# Patient Record
Sex: Female | Born: 1985 | Hispanic: Yes | Marital: Single | Smoking: Never smoker
Health system: Southern US, Community
[De-identification: ages and names within clinical notes are randomized; demographics above are authoritative.]

## PROBLEM LIST (undated history)

## (undated) ENCOUNTER — Inpatient Hospital Stay (HOSPITAL_COMMUNITY): Payer: Self-pay

## (undated) DIAGNOSIS — J45909 Unspecified asthma, uncomplicated: Secondary | ICD-10-CM

## (undated) HISTORY — PX: BARTHOLIN GLAND CYST EXCISION: SHX565

---

## 2009-03-30 ENCOUNTER — Ambulatory Visit (HOSPITAL_BASED_OUTPATIENT_CLINIC_OR_DEPARTMENT_OTHER): Admission: RE | Admit: 2009-03-30 | Discharge: 2009-03-30 | Payer: Self-pay | Admitting: Obstetrics and Gynecology

## 2011-04-04 LAB — POCT PREGNANCY, URINE: Preg Test, Ur: NEGATIVE

## 2011-05-08 NOTE — Op Note (Signed)
NAMEMERILEE, WIBLE             ACCOUNT NO.:  [id number]   MEDICAL RECORD NO.:  [id number]          PATIENT TYPE:  AMB   LOCATION:  [facility]                         FACILITY:  [facility]   PHYSICIAN:  Sherry A. Dickstein, M.D.DATE OF BIRTH:  1986/11/27   DATE OF PROCEDURE:  03/30/2009  DATE OF DISCHARGE:                               OPERATIVE REPORT   PREOPERATIVE DIAGNOSIS:  Left Bartholin cyst.   POSTOPERATIVE DIAGNOSIS:  Left Bartholin cyst.   PROCEDURE:  Left marsupialization of Bartholin cyst.   SURGEON:  Sherry A. Rosalio Macadamia, M.D.   ANESTHESIA:  General.   INDICATIONS:  This is a 25 year old G 0, P0 woman who has had an ongoing  left Bartholin cyst for several years.  This has gotten significantly  worse over the past few months and the patient requests surgical  intervention.  The patient has had this incised and drained in the past  but with recurrence.  Because of this, the patient requests going to the  operating room for marsupialization.   FINDINGS:  Approximately a 4 to 5 cm left Bartholin's cyst.  Once  opened, a significant amount of chocolate fluid was present.   PROCEDURE:  The patient is brought into the operating room and given  adequate general anesthesia.  She was placed in a dorsal lithotomy  position.  Her perineum was washed with Betadine.  She was draped in a  sterile fashion.  The area of the left Bartholin's gland was inspected.  Once this area was well enough visualized, it was decided that the  incision needed to be made partially inside the introitus and partially  outside of the introitus.  An elliptical incision was made and the  epithelium was excised.  The inside epithelium of the cyst was incised  in a linear fashion and copious amounts of chocolate fluid was released.  This incision was extended and the edges of the cyst and epithelial  tissues were infiltrated with 0.5% Marcaine with epinephrine.  The edges  were then approximated with 3-0 plain  in interrupted stitches  circumferentially until adequate hemostasis was present and the internal  cyst wall was approximated to the external epithelium.  Once adequate  hemostasis was present, these marsupialization was completed, all fluid  had been removed from the cyst and there were no solid nodules present  inside the cyst wall.  The patient was removed from the dorsal lithotomy  position after being cleaned up.  She was extubated.  She was moved from  the operating table to a stretcher in stable condition.  Complications  were none.  Estimated blood loss was 5 mL.      Sherry A. Rosalio Macadamia, M.D.  Electronically Signed    SAD/MEDQ  D:  03/30/2009  T:  03/30/2009  Job:  [id number]

## 2011-12-25 NOTE — L&D Delivery Note (Signed)
Pt had pitocin aug throughout labor. During labor she had occ decels, but maintained good BTB variability and accels. She pushed for 2 hours and brought the baby to +2 station. The variable decels were becoming deeper so the Simpson forceps were placed in the LOA position. She delivered one live viable female infant over a 3-0 degree midline tear. Placenta-S/I. EBL-400cc. NICU was present for delivery. Tear closed with 2-0 Vicryl and 3-0 Chromic.

## 2012-01-30 ENCOUNTER — Ambulatory Visit (INDEPENDENT_AMBULATORY_CARE_PROVIDER_SITE_OTHER): Payer: Managed Care, Other (non HMO) | Admitting: Family Medicine

## 2012-01-30 VITALS — BP 135/80 | HR 73 | Temp 97.6°F | Resp 16 | Ht 69.0 in | Wt 207.0 lb

## 2012-01-30 DIAGNOSIS — R5381 Other malaise: Secondary | ICD-10-CM

## 2012-01-30 DIAGNOSIS — R635 Abnormal weight gain: Secondary | ICD-10-CM

## 2012-01-30 DIAGNOSIS — R5383 Other fatigue: Secondary | ICD-10-CM

## 2012-01-30 DIAGNOSIS — J029 Acute pharyngitis, unspecified: Secondary | ICD-10-CM

## 2012-01-30 LAB — POCT CBC
Granulocyte percent: 54.1 %G (ref 37–80)
MID (cbc): 0.8 (ref 0–0.9)
MPV: 8.9 fL (ref 0–99.8)
POC MID %: 9.2 %M (ref 0–12)
Platelet Count, POC: 376 10*3/uL (ref 142–424)
RBC: 4.84 M/uL (ref 4.04–5.48)

## 2012-01-30 MED ORDER — AMOXICILLIN 500 MG PO TABS: ORAL_TABLET | ORAL | Status: DC

## 2012-01-30 NOTE — Patient Instructions (Signed)
Throat culture and thyroid tests were sent to lab.  If rash occurs, or any worsening, return to clinic.   Pharyngitis, Viral and Bacterial Pharyngitis is soreness (inflammation) or infection of the pharynx. It is also called a sore throat. CAUSES  Most sore throats are caused by viruses and are part of a cold. However, some sore throats are caused by strep and other bacteria. Sore throats can also be caused by post nasal drip from draining sinuses, allergies and sometimes from sleeping with an open mouth. Infectious sore throats can be spread from person to person by coughing, sneezing and sharing cups or eating utensils. TREATMENT  Sore throats that are viral usually last 3-4 days. Viral illness will get better without medications (antibiotics). Strep throat and other bacterial infections will usually begin to get better about 24-48 hours after you begin to take antibiotics. HOME CARE INSTRUCTIONS   If the caregiver feels there is a bacterial infection or if there is a positive strep test, they will prescribe an antibiotic. The full course of antibiotics must be taken. If the full course of antibiotic is not taken, you or your child may become ill again. If you or your child has strep throat and do not finish all of the medication, serious heart or kidney diseases may develop.   Drink enough water and fluids to keep your urine clear or pale yellow.   Only take over-the-counter or prescription medicines for pain, discomfort or fever as directed by your caregiver.   Get lots of rest.   Gargle with salt water ( tsp. of salt in a glass of water) as often as every 1-2 hours as you need for comfort.   Hard candies may soothe the throat if individual is not at risk for choking. Throat sprays or lozenges may also be used.  SEEK MEDICAL CARE IF:   Large, tender lumps in the neck develop.   A rash develops.   Green, yellow-brown or bloody sputum is coughed up.   Your baby is older than 3 months  with a rectal temperature of 100.5 F (38.1 C) or higher for more than 1 day.  SEEK IMMEDIATE MEDICAL CARE IF:   A stiff neck develops.   You or your child are drooling or unable to swallow liquids.   You or your child are vomiting, unable to keep medications or liquids down.   You or your child has severe pain, unrelieved with recommended medications.   You or your child are having difficulty breathing (not due to stuffy nose).   You or your child are unable to fully open your mouth.   You or your child develop redness, swelling, or severe pain anywhere on the neck.   You have a fever.   Your baby is older than 3 months with a rectal temperature of 102 F (38.9 C) or higher.   Your baby is 27 months old or younger with a rectal temperature of 100.4 F (38 C) or higher.  MAKE SURE YOU:   Understand these instructions.   Will watch your condition.   Will get help right away if you are not doing well or get worse.  Document Released: 12/10/2005 Document Revised: 08/22/2011 Document Reviewed: 03/08/2008 [facility] Patient Information 2012 [place], [place].  Return to the clinic or go to the nearest emergency room if any of your symptoms worsen or new symptoms occur.

## 2012-01-30 NOTE — Progress Notes (Signed)
Addended by: Meredith Staggers R on: 01/30/2012 09:20 PM   Modules accepted: Orders

## 2012-01-30 NOTE — Progress Notes (Signed)
Subjective:    Patient ID: Kristy Tucker, female    DOB: [birth year], 26 y.o.   MRN: [id number]  HPI Congestion/cough x 1 month.  Hx of menieres off and on migraines - chronic.  Woke up this am with sore throat, hard to swallow, pus in back of throat.  NO known fever.   Boyfriend with congestion/cough. No known sick contacts with mono or strep.    Tx: none   Review of Systems  Constitutional: Positive for fatigue and unexpected weight change. Negative for fever and chills.       Wt gain x 2-3 months, taken iodine supplements occasionally (takes multiple herbal supplements).  Fatigue x past month.  HENT: Positive for congestion, sore throat and trouble swallowing. Negative for hearing loss, ear pain, neck pain, neck stiffness, voice change and ear discharge.   Eyes: Negative for discharge and redness.  Respiratory: Positive for cough and shortness of breath. Negative for chest tightness.        Occasional dyspnea- felt some today.  Cardiovascular: Negative for leg swelling.  Gastrointestinal: Negative for nausea, vomiting, abdominal pain, diarrhea, constipation, blood in stool and anal bleeding.  Skin: Negative for rash.  Hematological: Positive for adenopathy.  LMP currently - normal menses.     Objective:   Physical Exam  Constitutional: She is oriented to person, place, and time. She appears well-developed and well-nourished.  HENT:  Head: Normocephalic and atraumatic. No trismus in the jaw.  Right Ear: Tympanic membrane, external ear and ear canal normal.  Left Ear: Tympanic membrane, external ear and ear canal normal.  Mouth/Throat: Uvula is midline and mucous membranes are normal. No uvula swelling. Oropharyngeal exudate and posterior oropharyngeal erythema present. No posterior oropharyngeal edema or tonsillar abscesses.    Neck: Phonation normal. No thyromegaly present.  Cardiovascular: Normal rate, regular rhythm and normal heart sounds.   No murmur  heard. Pulmonary/Chest: Effort normal and breath sounds normal. No respiratory distress. She has no wheezes.  Abdominal: Soft. She exhibits no distension and no mass. There is no hepatosplenomegaly. There is no tenderness.  Lymphadenopathy:    She has cervical adenopathy.  Neurological: She is alert and oriented to person, place, and time.  Skin: Skin is warm and dry. She is not diaphoretic.  Psychiatric: She has a normal mood and affect. Her behavior is normal.    Results for orders placed in visit on 01/30/12  POCT RAPID STREP A (OFFICE)      Component Value Range   Rapid Strep A Screen Negative  Negative   POCT CBC      Component Value Range   WBC 8.6  4.6 - 10.2 (K/uL)   Lymph, poc 3.2  0.6 - 3.4    POC LYMPH PERCENT 36.7  10 - 50 (%L)   MID (cbc) 0.8  0 - 0.9    POC MID % 9.2  0 - 12 (%M)   POC Granulocyte 4.7  2 - 6.9    Granulocyte percent 54.1  37 - 80 (%G)   RBC 4.84  4.04 - 5.48 (M/uL)   Hemoglobin 12.5  12.2 - 16.2 (g/dL)   HCT, POC 40.9  81.1 - 47.9 (%)   MCV 83.4  80 - 97 (fL)   MCH, POC 25.8 (*) 27 - 31.2 (pg)   MCHC 30.9 (*) 31.8 - 35.4 (g/dL)   RDW, POC 91.4     Platelet Count, POC 376  142 - 424 (K/uL)   MPV 8.9  0 - 99.8 (fL)  Assessment & Plan:  Emylee Decelle is a 26 y.o. female 1. Pharyngitis  POCT rapid strep A, POCT CBC  2. Fatigue  POCT CBC, TSH  3. Weight gain, abnormal  TSH   Exudative tonsilitis - possible false neg strep screen, vs. CMV vs. EBV.  Reassuring cbc. Check throat cx, start amox 500tid (rtc if any rash), sx care - motrin, cold fluids, lozenges, rtc precautions, contact precautions.  Fatigue with wt gain.  Check TSH, may be due to current illness.  If TSH wnl, consider EBV/CMV titers.

## 2012-02-02 LAB — CULTURE, GROUP A STREP: Organism ID, Bacteria: NORMAL

## 2012-10-02 ENCOUNTER — Ambulatory Visit: Payer: Managed Care, Other (non HMO) | Admitting: Family Medicine

## 2012-10-07 ENCOUNTER — Ambulatory Visit: Payer: Self-pay | Admitting: Family Medicine

## 2012-10-07 ENCOUNTER — Encounter: Payer: Self-pay | Admitting: Family Medicine

## 2012-10-07 VITALS — BP 118/78 | HR 81 | Temp 98.2°F | Resp 16 | Ht 68.5 in | Wt 241.8 lb

## 2012-10-07 DIAGNOSIS — Z0000 Encounter for general adult medical examination without abnormal findings: Secondary | ICD-10-CM | POA: Insufficient documentation

## 2012-10-07 DIAGNOSIS — Z23 Encounter for immunization: Secondary | ICD-10-CM

## 2012-10-07 NOTE — Patient Instructions (Signed)
You have requested [facility] be your primary care provider; I will be your physician and you can be seen by appointment at [address] If you have any emergencies or I am not available, you can be seen next door at [address] Best wishes for the remainder of your pregnancy and a "blissful" labor and delivery of your baby boy.  I will see you sometime next year.

## 2012-10-07 NOTE — Progress Notes (Signed)
S:  This 26 y.o. Female is [redacted] weeks pregnant and here for Tdap to be administered. This immunization was not available at her OB/GYN's office. She has been well, w/o any symptoms of URI (cough, fever, HA, fatigue,etc). This is here 1st child  and it is a boy. She plans to breast-feed; she has gained ~ 29 lbs and has had problems with pedal edema. Otherwise, she feels well.  O:  Filed Vitals:   10/07/12 1349  BP: 118/78  Pulse: 81  Temp: 98.2 F (36.8 C)  Resp: 16    GEN: In NAD; WN,WD. Near term pregnancy. HENT: [place]/AT; EOMI, conj/scl clear; post ph clear w/o erythema. COR: RRR. Trace-1+ pretibial edema. LUNGS: Normal resp rate; unlabored. NEURO: A&O x 3; CNs intact. Nonfocal.  A/P:  1. Need for prophylactic vaccination with combined diphtheria-tetanus-pertussis (DTP) vaccine  Tdap vaccine greater than or equal to 7yo IM

## 2012-10-30 ENCOUNTER — Inpatient Hospital Stay (HOSPITAL_COMMUNITY)
Admission: AD | Admit: 2012-10-30 | Discharge: 2012-11-01 | DRG: 775 | Disposition: A | Discharge status: Home / Self Care | Payer: Medicaid Other | Source: Ambulatory Visit | Attending: Obstetrics and Gynecology | Admitting: Obstetrics and Gynecology

## 2012-10-30 ENCOUNTER — Inpatient Hospital Stay (HOSPITAL_COMMUNITY): Payer: Medicaid Other

## 2012-10-30 ENCOUNTER — Encounter (HOSPITAL_COMMUNITY): Payer: Self-pay | Admitting: Anesthesiology

## 2012-10-30 ENCOUNTER — Inpatient Hospital Stay (HOSPITAL_COMMUNITY): Payer: Medicaid Other | Admitting: Anesthesiology

## 2012-10-30 ENCOUNTER — Encounter (HOSPITAL_COMMUNITY): Payer: Self-pay

## 2012-10-30 HISTORY — DX: Unspecified asthma, uncomplicated: J45.909

## 2012-10-30 LAB — CBC
Hemoglobin: 11.6 g/dL — ABNORMAL LOW (ref 12.0–15.0)
MCH: 26.7 pg (ref 26.0–34.0)
MCV: 78.8 fL (ref 78.0–100.0)
RBC: 4.34 MIL/uL (ref 3.87–5.11)

## 2012-10-30 LAB — TYPE AND SCREEN: Antibody Screen: NEGATIVE

## 2012-10-30 MED ORDER — SIMETHICONE 80 MG PO CHEW: 80.0000 mg | CHEWABLE_TABLET | ORAL | Status: DC | PRN

## 2012-10-30 MED ORDER — EPHEDRINE 5 MG/ML INJ: 10.0000 mg | INTRAVENOUS | Status: DC | PRN

## 2012-10-30 MED ORDER — OXYTOCIN 40 UNITS IN LACTATED RINGERS INFUSION - SIMPLE MED: 62.5000 mL/h | INTRAVENOUS | Status: DC

## 2012-10-30 MED ORDER — WITCH HAZEL-GLYCERIN EX PADS: 1.0000 "application " | MEDICATED_PAD | CUTANEOUS | Status: DC | PRN

## 2012-10-30 MED ORDER — OXYCODONE-ACETAMINOPHEN 5-325 MG PO TABS: 1.0000 | ORAL_TABLET | ORAL | Status: DC | PRN

## 2012-10-30 MED ORDER — OXYTOCIN BOLUS FROM INFUSION: 500.0000 mL | INTRAVENOUS | Status: DC

## 2012-10-30 MED ORDER — TERBUTALINE SULFATE 1 MG/ML IJ SOLN
0.2500 mg | Freq: Once | INTRAMUSCULAR | Status: AC | PRN
  Administered 2012-10-30: 0.25 mg via SUBCUTANEOUS

## 2012-10-30 MED ORDER — PHENYLEPHRINE 40 MCG/ML (10ML) SYRINGE FOR IV PUSH (FOR BLOOD PRESSURE SUPPORT): 80.0000 ug | PREFILLED_SYRINGE | INTRAVENOUS | Status: DC | PRN

## 2012-10-30 MED ORDER — ZOLPIDEM TARTRATE 5 MG PO TABS: 5.0000 mg | ORAL_TABLET | Freq: Every evening | ORAL | Status: DC | PRN

## 2012-10-30 MED ORDER — LIDOCAINE HCL (PF) 1 % IJ SOLN
30.0000 mL | INTRAMUSCULAR | Status: DC | PRN
  Filled 2012-10-30: qty 30

## 2012-10-30 MED ORDER — ONDANSETRON HCL 4 MG PO TABS: 4.0000 mg | ORAL_TABLET | ORAL | Status: DC | PRN

## 2012-10-30 MED ORDER — SODIUM BICARBONATE 8.4 % IV SOLN
INTRAVENOUS | Status: DC | PRN
  Administered 2012-10-30: 5 mL via EPIDURAL

## 2012-10-30 MED ORDER — LACTATED RINGERS IV SOLN
INTRAVENOUS | Status: DC
  Administered 2012-10-30 (×2): via INTRAUTERINE

## 2012-10-30 MED ORDER — DIPHENHYDRAMINE HCL 50 MG/ML IJ SOLN: 12.5000 mg | INTRAMUSCULAR | Status: DC | PRN

## 2012-10-30 MED ORDER — BENZOCAINE-MENTHOL 20-0.5 % EX AERO: 1.0000 "application " | INHALATION_SPRAY | CUTANEOUS | Status: DC | PRN

## 2012-10-30 MED ORDER — PHENYLEPHRINE 40 MCG/ML (10ML) SYRINGE FOR IV PUSH (FOR BLOOD PRESSURE SUPPORT)
80.0000 ug | PREFILLED_SYRINGE | INTRAVENOUS | Status: DC | PRN
  Filled 2012-10-30: qty 5

## 2012-10-30 MED ORDER — DIBUCAINE 1 % RE OINT: 1.0000 "application " | TOPICAL_OINTMENT | RECTAL | Status: DC | PRN

## 2012-10-30 MED ORDER — MEASLES, MUMPS & RUBELLA VAC ~~LOC~~ INJ
0.5000 mL | INJECTION | Freq: Once | SUBCUTANEOUS | Status: DC
  Filled 2012-10-30: qty 0.5

## 2012-10-30 MED ORDER — ONDANSETRON HCL 4 MG/2ML IJ SOLN: 4.0000 mg | Freq: Four times a day (QID) | INTRAMUSCULAR | Status: DC | PRN

## 2012-10-30 MED ORDER — EPHEDRINE 5 MG/ML INJ
10.0000 mg | INTRAVENOUS | Status: DC | PRN
  Filled 2012-10-30: qty 4

## 2012-10-30 MED ORDER — TETANUS-DIPHTH-ACELL PERTUSSIS 5-2.5-18.5 LF-MCG/0.5 IM SUSP: 0.5000 mL | Freq: Once | INTRAMUSCULAR | Status: DC

## 2012-10-30 MED ORDER — IBUPROFEN 600 MG PO TABS
600.0000 mg | ORAL_TABLET | Freq: Four times a day (QID) | ORAL | Status: DC
  Administered 2012-10-31 – 2012-11-01 (×6): 600 mg via ORAL
  Filled 2012-10-30 (×6): qty 1

## 2012-10-30 MED ORDER — SENNOSIDES-DOCUSATE SODIUM 8.6-50 MG PO TABS
2.0000 | ORAL_TABLET | Freq: Every day | ORAL | Status: DC
  Administered 2012-10-31 (×2): 2 via ORAL

## 2012-10-30 MED ORDER — CITRIC ACID-SODIUM CITRATE 334-500 MG/5ML PO SOLN: 30.0000 mL | ORAL | Status: DC | PRN

## 2012-10-30 MED ORDER — TERBUTALINE SULFATE 1 MG/ML IJ SOLN
INTRAMUSCULAR | Status: AC
  Filled 2012-10-30: qty 1

## 2012-10-30 MED ORDER — ONDANSETRON HCL 4 MG/2ML IJ SOLN: 4.0000 mg | INTRAMUSCULAR | Status: DC | PRN

## 2012-10-30 MED ORDER — OXYTOCIN 40 UNITS IN LACTATED RINGERS INFUSION - SIMPLE MED
1.0000 m[IU]/min | INTRAVENOUS | Status: DC
Start: 2012-10-30 — End: 2012-10-30
  Administered 2012-10-30: 1 m[IU]/min via INTRAVENOUS
  Filled 2012-10-30: qty 1000

## 2012-10-30 MED ORDER — LIDOCAINE HCL (PF) 1 % IJ SOLN
INTRAMUSCULAR | Status: DC | PRN
  Administered 2012-10-30 (×2): 2 mL

## 2012-10-30 MED ORDER — LACTATED RINGERS IV SOLN
500.0000 mL | INTRAVENOUS | Status: DC | PRN
  Administered 2012-10-30 (×2): 500 mL via INTRAVENOUS

## 2012-10-30 MED ORDER — FENTANYL 2.5 MCG/ML BUPIVACAINE 1/10 % EPIDURAL INFUSION (WH - ANES)
14.0000 mL/h | INTRAMUSCULAR | Status: DC
  Administered 2012-10-30: 14 mL/h via EPIDURAL
  Filled 2012-10-30: qty 125

## 2012-10-30 MED ORDER — ACETAMINOPHEN 325 MG PO TABS: 650.0000 mg | ORAL_TABLET | ORAL | Status: DC | PRN

## 2012-10-30 MED ORDER — LACTATED RINGERS IV SOLN
INTRAVENOUS | Status: DC
Start: 2012-10-30 — End: 2012-10-30
  Administered 2012-10-30 (×3): via INTRAVENOUS

## 2012-10-30 MED ORDER — IBUPROFEN 600 MG PO TABS: 600.0000 mg | ORAL_TABLET | Freq: Four times a day (QID) | ORAL | Status: DC | PRN

## 2012-10-30 MED ORDER — LACTATED RINGERS IV SOLN
500.0000 mL | Freq: Once | INTRAVENOUS | Status: AC
  Administered 2012-10-30: 500 mL via INTRAVENOUS
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
## 2012-10-30 NOTE — MAU Note (Signed)
Pt states contractions started around 2000 and are about every 4 minutes. States she does not think her water has broken.

## 2012-10-30 NOTE — Anesthesia Procedure Notes (Signed)

## 2012-10-30 NOTE — Progress Notes (Signed)
Called to see pt during a decel. Pt had a spontaneous decel to 60's for 3-4 minutes. The Pitocin was turned off and she given SQ terb.  FHTs continued to have good BTB variability.  Now reactive without decels. Cx-C/9/0. Will follow and restart pitocin at 4:30.

## 2012-10-30 NOTE — Anesthesia Preprocedure Evaluation (Signed)
Anesthesia Evaluation  Patient identified by MRN, date of birth, ID band Patient awake    Reviewed: Allergy & Precautions, H&P , Patient's Chart, lab work & pertinent test results, reviewed documented beta blocker date and time   History of Anesthesia Complications Negative for: history of anesthetic complications  Airway Mallampati: II TM Distance: >3 FB Neck ROM: full    Dental No notable dental hx.    Pulmonary neg pulmonary ROS, asthma ,  breath sounds clear to auscultation  Pulmonary exam normal       Cardiovascular Exercise Tolerance: Good negative cardio ROS  Rhythm:regular Rate:Normal     Neuro/Psych negative neurological ROS  negative psych ROS   GI/Hepatic negative GI ROS, Neg liver ROS,   Endo/Other  negative endocrine ROS  Renal/GU negative Renal ROS     Musculoskeletal   Abdominal   Peds  Hematology negative hematology ROS (+)   Anesthesia Other Findings   Reproductive/Obstetrics negative OB ROS (+) Pregnancy                           Anesthesia Physical Anesthesia Plan  ASA: II  Anesthesia Plan: Epidural   Post-op Pain Management:    Induction:   Airway Management Planned:   Additional Equipment:   Intra-op Plan:   Post-operative Plan:   Informed Consent: I have reviewed the patients History and Physical, chart, labs and discussed the procedure including the risks, benefits and alternatives for the proposed anesthesia with the patient or authorized representative who has indicated his/her understanding and acceptance.   Dental Advisory Given  Plan Discussed with: CRNA and Surgeon  Anesthesia Plan Comments:         Anesthesia Quick Evaluation

## 2012-10-30 NOTE — Consult Note (Signed)
Neonatology Note:   Attendance at Delivery:    I was asked to attend this NSVD at term due to FHR decelerations and meconium-stained fluid. The mother is a G4P0A3 O pos, GBS neg with amnioinfusion. ROM 9 hours prior to delivery, fluid meconium-stained. Infant vigorous with good spontaneous cry and tone, but dusky color. Needed only minimal bulb suctioning. Ap 8/9. Lungs clear to ausc in DR. Allowed to stay with parents for skin to skin time. To CN to care of Pediatrician.   Deatra James, MD

## 2012-10-30 NOTE — Plan of Care (Signed)
Problem: Consults Goal: Birthing Suites Patient Information Press F2 to bring up selections list  Outcome: Completed/Met Date Met:  10/30/12  Pt 37-[redacted] weeks EGA

## 2012-10-31 ENCOUNTER — Encounter (HOSPITAL_COMMUNITY): Payer: Self-pay

## 2012-10-31 LAB — CBC
HCT: 29.4 % — ABNORMAL LOW (ref 36.0–46.0)
Hemoglobin: 9.7 g/dL — ABNORMAL LOW (ref 12.0–15.0)
MCHC: 33 g/dL (ref 30.0–36.0)

## 2012-10-31 NOTE — Anesthesia Postprocedure Evaluation (Signed)
  Anesthesia Post-op Note  Patient: Kristy Tucker  Procedure(s) Performed: * No procedures listed *  Patient Location: Mother/Baby  Anesthesia Type:Epidural  Level of Consciousness: awake, alert  and oriented  Airway and Oxygen Therapy: Patient Spontanous Breathing  Post-op Pain: none  Post-op Assessment: Post-op Vital signs reviewed, Patient's Cardiovascular Status Stable, Respiratory Function Stable, Patent Airway, No signs of Nausea or vomiting, Pain level controlled, No headache, No backache, No residual numbness and No residual motor weakness  Post-op Vital Signs: Reviewed and stable  Complications: No apparent anesthesia complications

## 2012-10-31 NOTE — Progress Notes (Signed)
UR Chart review completed.  

## 2012-10-31 NOTE — Progress Notes (Signed)
Post Partum Day 1 Subjective: no complaints  Objective: Blood pressure 133/80, pulse 84, temperature 97.6 F (36.4 C), temperature source Oral, resp. rate 18, height 5\' 8"  (1.727 m), weight 247 lb (112.038 kg), breastfeeding.  Physical Exam:  General: alert Lochia: appropriate Uterine Fundus: firm   Basename 10/31/12 0600 10/30/12 0855  HGB 9.7* 11.6*  HCT 29.4* 34.2*    Assessment/Plan: Plan for discharge tomorrow   LOS: 1 day   Vallen Calabrese D 10/31/2012, 10:10 AM

## 2012-11-01 NOTE — Discharge Summary (Signed)
Obstetric Discharge Summary Reason for Admission: onset of labor Prenatal Procedures: none Intrapartum Procedures: forceps (low) Postpartum Procedures: none Complications-Operative and Postpartum: 3rd degree perineal laceration Hemoglobin  Date Value Range Status  10/31/2012 9.7* 12.0 - 15.0 g/dL Final  12/29/1094 04.5  40.9 - 16.2 g/dL Final     HCT  Date Value Range Status  10/31/2012 29.4* 36.0 - 46.0 % Final     HCT, POC  Date Value Range Status  01/30/2012 40.4  37.7 - 47.9 % Final    Physical Exam:  General: alert Lochia: appropriate Uterine Fundus: firm  Discharge Diagnoses: Term Pregnancy-delivered  Discharge Information: Date: 11/01/2012 Activity: pelvic rest Diet: routine Medications: PNV, Ibuprofen and Colace Condition: stable Instructions: refer to practice specific booklet Discharge to: home Follow-up Information    Follow up with Levi Aland, MD. Schedule an appointment as soon as possible for a visit in 4 weeks.   Contact information:   [address] [place] [address] [phone]          Newborn Data: Live born female  Birth Weight: 6 lb 15.8 oz (3170 g) APGAR: 8, 9  Home with mother.  Kristy Tucker D 11/01/2012, 10:14 AM

## 2012-11-03 ENCOUNTER — Inpatient Hospital Stay (HOSPITAL_COMMUNITY)
Admission: AD | Admit: 2012-11-03 | Discharge: 2012-11-03 | Disposition: A | Discharge status: Home / Self Care | Payer: Medicaid Other | Source: Ambulatory Visit | Attending: Obstetrics and Gynecology | Admitting: Obstetrics and Gynecology

## 2012-11-03 ENCOUNTER — Encounter (HOSPITAL_COMMUNITY): Payer: Self-pay | Admitting: *Deleted

## 2012-11-03 DIAGNOSIS — O9122 Nonpurulent mastitis associated with the puerperium: Secondary | ICD-10-CM | POA: Insufficient documentation

## 2012-11-03 DIAGNOSIS — O864 Pyrexia of unknown origin following delivery: Secondary | ICD-10-CM | POA: Insufficient documentation

## 2012-11-03 LAB — CBC
MCH: 26.6 pg (ref 26.0–34.0)
MCV: 80.3 fL (ref 78.0–100.0)
Platelets: 333 10*3/uL (ref 150–400)
RBC: 4.32 MIL/uL (ref 3.87–5.11)

## 2012-11-03 MED ORDER — CEPHALEXIN 500 MG PO CAPS: 500.0000 mg | ORAL_CAPSULE | Freq: Four times a day (QID) | ORAL | Status: DC

## 2012-11-03 MED ORDER — CEPHALEXIN 500 MG PO CAPS
500.0000 mg | ORAL_CAPSULE | Freq: Once | ORAL | Status: AC
  Administered 2012-11-03: 500 mg via ORAL
  Filled 2012-11-03: qty 1

## 2012-11-03 NOTE — MAU Note (Signed)
Pt reports vaginal delivery on 10/30/2012, states she has been having a fever since 2 pm today and chills since yesterday. Breast feeding but no problems. Minimal bleeding. Had third degree laceration and has some discomfort there.

## 2012-11-03 NOTE — MAU Provider Note (Signed)
  History     CSN: [id number]  Arrival date and time: 11/03/12 2054   First Provider Initiated Contact with Patient 11/03/12 2243      Chief Complaint  Patient presents with  . Fever   HPI  Kristy Tucker is a 26 y.o.. G4P1031 @ 4 days PP. She developed chills yesterday, and today she had a fever 102 at home. She is breastfeeding, and states that it going well. She said her milk came in today. She feels like the baby is able to empty the breast, and she denies any nipple pain or damage. She has occasional cramping. She states her bleeding has been very light. She denies any odor.  She had stiches on her perineum and states that they feel uncomfortable. She also has a hemorrhoid.  Past Medical History  Diagnosis Date  . Asthma     Past Surgical History  Procedure Date  . Bartholin gland cyst excision   . No past surgeries     Family History  Problem Relation Age of Onset  . Hearing loss Mother   . Asthma Brother   . Diabetes Maternal Uncle     History  Substance Use Topics  . Smoking status: Never Smoker   . Smokeless tobacco: Not on file  . Alcohol Use: No    Allergies:  Allergies  Allergen Reactions  . Latex Rash    Rash if wearing gloves    Prescriptions prior to admission  Medication Sig Dispense Refill  . docusate sodium (COLACE) 100 MG capsule Take 100 mg by mouth 2 (two) times daily.      Marland Kitchen ibuprofen (ADVIL,MOTRIN) 400 MG tablet Take 200 mg by mouth every 6 (six) hours as needed.      . Prenatal Vit-Fe Sulfate-FA (PRENATAL VITAMIN PO) Take by mouth. DHA CONCEPT      . albuterol (PROVENTIL HFA;VENTOLIN HFA) 108 (90 BASE) MCG/ACT inhaler Inhale 2 puffs into the lungs every 6 (six) hours as needed. For shortness of breath/wheezing        Review of Systems  Constitutional: Positive for fever and chills.  Gastrointestinal: Negative for nausea, vomiting, abdominal pain, diarrhea and constipation.  Genitourinary: Negative for dysuria, urgency and frequency.    Physical Exam   Blood pressure 133/78, pulse 118, temperature 100 F (37.8 C), temperature source Oral, resp. rate 20, height 5\' 8"  (1.727 m), weight 104.781 kg (231 lb), last menstrual period 01/30/2012, SpO2 100.00%, unknown if currently breastfeeding.  Physical Exam  Nursing note and vitals reviewed. Constitutional: She appears well-developed and well-nourished.  Cardiovascular: Normal rate.   Respiratory: Effort normal. Right breast exhibits nipple discharge (Nipple damage, bleeding). Left breast exhibits skin change (Left breast is red. see diagram).    GI: Soft. She exhibits no distension and no mass. There is no tenderness. There is no rebound and no guarding.  Genitourinary:        Uterus: normal involution, non tender External genitalia: normal No odor noted to lochia.     MAU Course  Procedures  2258: Spoke with Dr Dareen Piano. Reviewed PE and assessment. Plan to treat mastitis  Assessment and Plan   1. Mastitis, obstetric, delivered with postpartum condition   Keflex 500mg  PO QID X10 FU with Dr. Dareen Piano in 2 weeks Return to MAU if sx worsen  Kristy Tucker 11/03/2012, 10:43 PM

## 2013-02-02 IMAGING — US US FETAL BPP W/O NONSTRESS
1 series · 11 of 11 positions shown · non-contrast
Comparison: none

CLINICAL DATA: Contractions; non-reactive non stress test.

[Series 1: us fetal bpp w/o nonstress · non-contrast · 11 acquisitions, 11 frames shown]
[im 1/11]
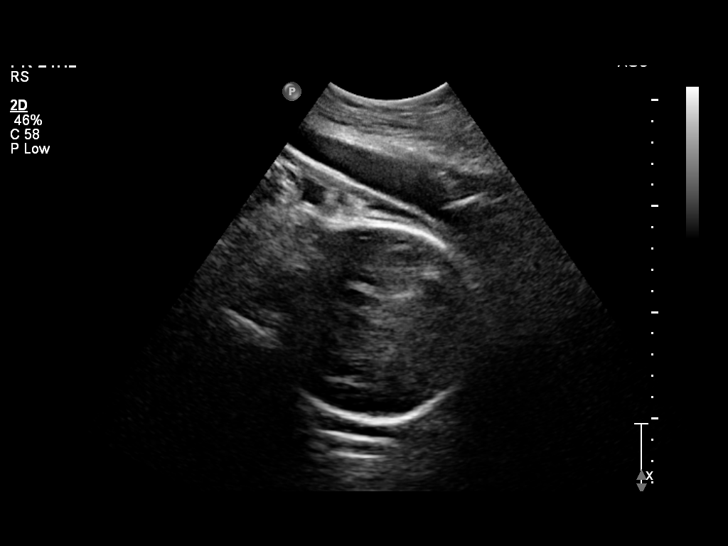
[im 2/11]
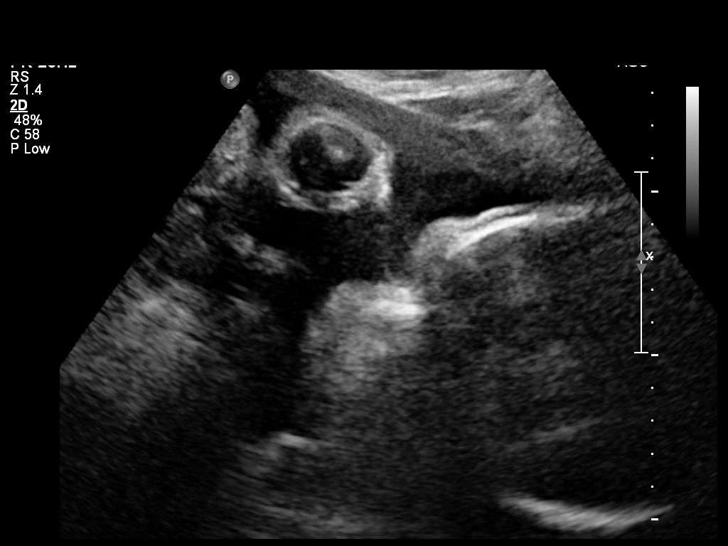
[im 3/11]
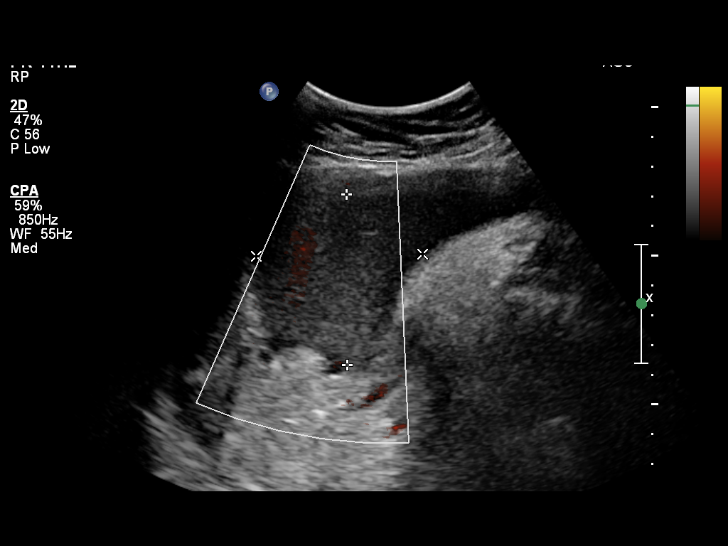
[im 4/11]
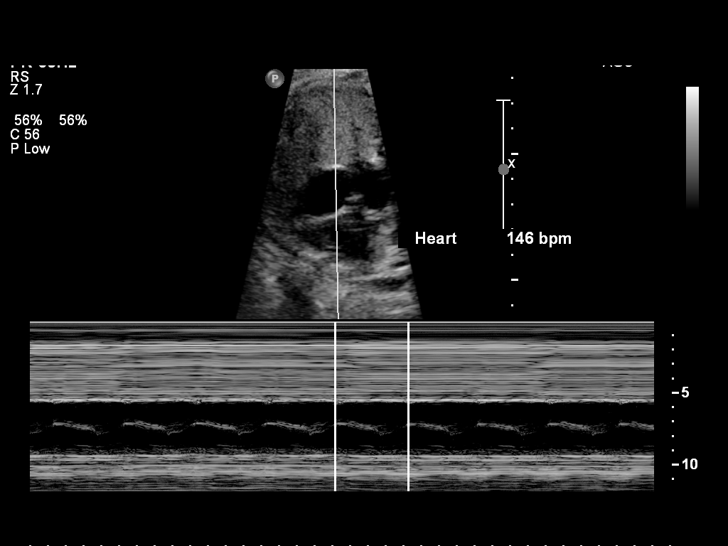
[im 5/11]
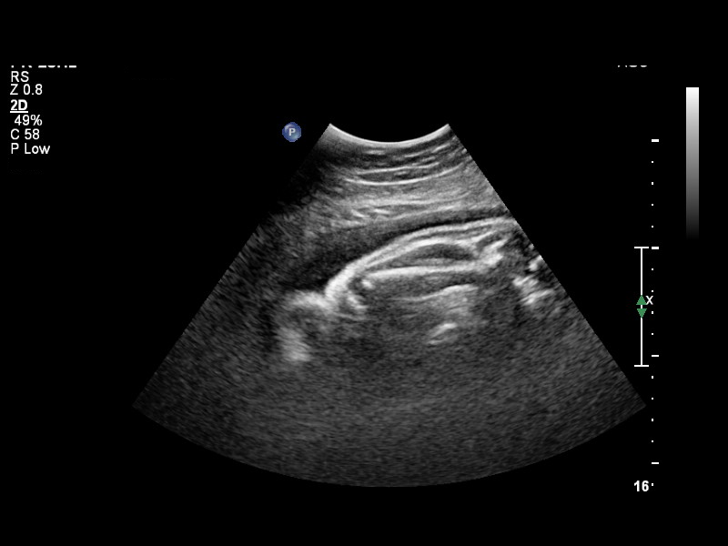
[im 6/11]
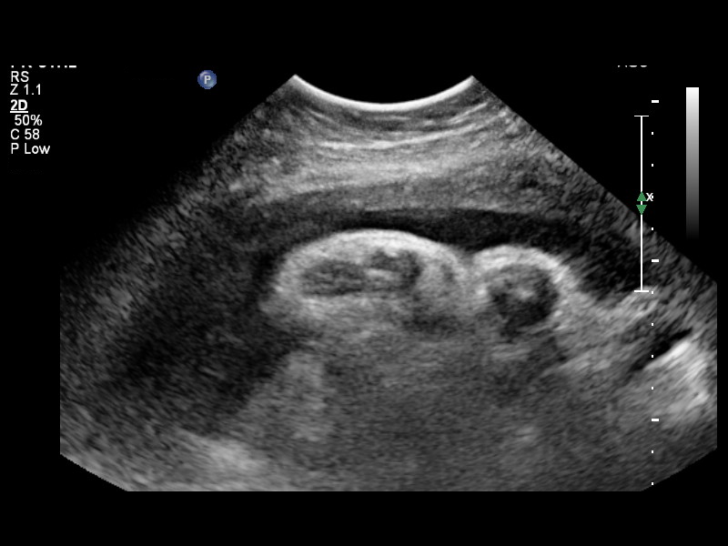
[im 7/11]
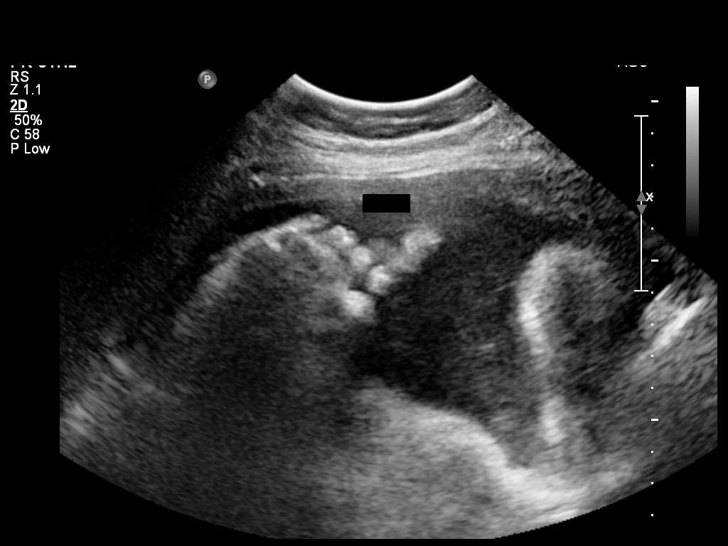
[im 8/11]
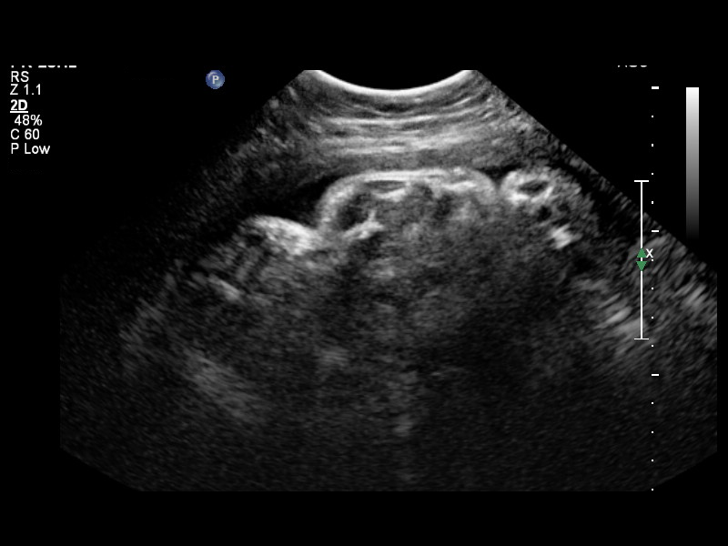
[im 9/11]
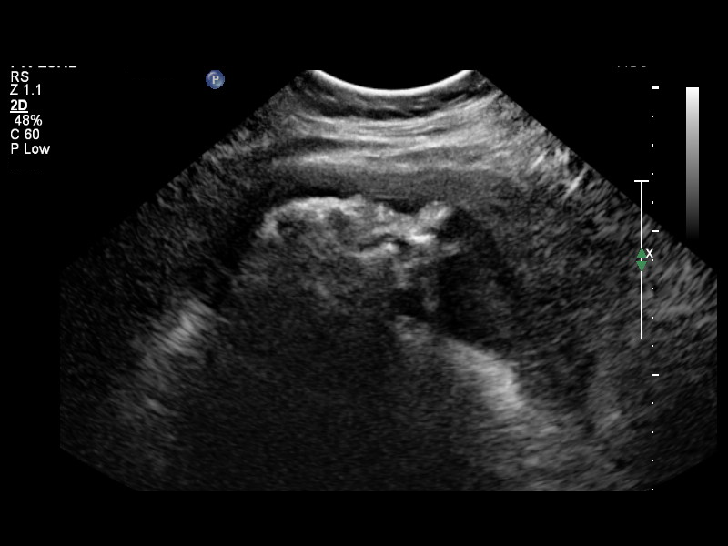
[im 10/11]
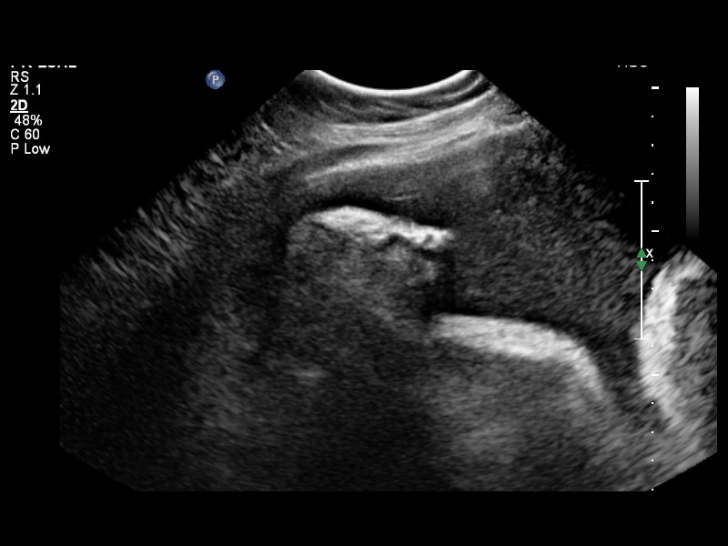
[im 11/11]
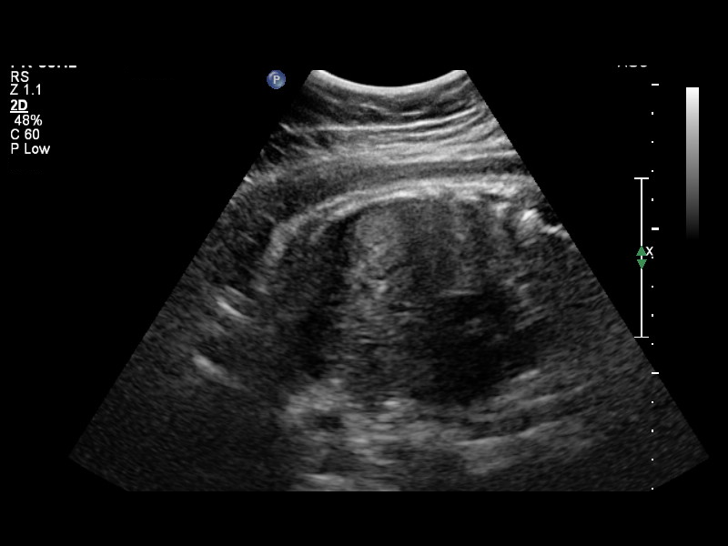

[11 of 11 positions shown; findings below may reference images not displayed]

BIOPHYSICAL PROFILE

Number of Fetuses: 1
Heart Rate: 146 bpm
Presentation: Cephalic
Movement: Yes
Placental Location:  Posterior
Previa:  No
Amniotic Fluid (Subjective):  Normal

Vertical pocket:  5.7 cm

MATERNAL FINDINGS:
Cervix:  Not assessed /
Uterus/Adnexae:  Not assessed

BPP:
Movement:  2      Time:  30 minutes
Breathing: 2
Tone:   2
Amniotic Fluid:  2

Total Score:  8 / 8
IMPRESSION: Single live intrauterine pregnancy noted, in cephalic presentation.
No evidence for placenta previa.  Subjectively normal amount of
amniotic fluid.  BPP score of [DATE].

Recommend followup with non-emergent complete OB 14+ wk US
examination for fetal biometric evaluation and anatomic survey if
not already performed.

## 2013-12-24 NOTE — L&D Delivery Note (Signed)
Delivery Note At 5:31 PM a non-viable and female fetus female was delivered via Vaginal, Spontaneous Delivery (Presentation: Right Occiput Anterior).  APGAR: 0, 0; weight .   Placenta status: Intact, Spontaneous.  Cord: 3 vessels. A loose nuchal cord was noted and reduced. There was no significant desquamation of the infants skin. No obvious anomalies were identified.    After delivery the infants cord was clamped and cut and the infant was passed to the isolet to cleaned and dressed. A small portion of tissue was harvested from the infants right ankle for chromosome studies.  Anesthesia: Epidural  Episiotomy: None Lacerations: 1st degree Suture Repair: 3.0 vicryl Est. Blood Loss (mL): 250  Mom to postpartum.  [facility]  Jes Costales H. 10/23/2014, 7:16 PM

## 2014-04-01 LAB — OB RESULTS CONSOLE HEPATITIS B SURFACE ANTIGEN: HEP B S AG: NEGATIVE

## 2014-04-01 LAB — OB RESULTS CONSOLE ABO/RH: RH Type: POSITIVE

## 2014-04-01 LAB — OB RESULTS CONSOLE ANTIBODY SCREEN: ANTIBODY SCREEN: NEGATIVE

## 2014-04-01 LAB — OB RESULTS CONSOLE HIV ANTIBODY (ROUTINE TESTING): HIV: NONREACTIVE

## 2014-04-01 LAB — OB RESULTS CONSOLE GC/CHLAMYDIA
Chlamydia: NEGATIVE
Gonorrhea: NEGATIVE

## 2014-04-01 LAB — OB RESULTS CONSOLE RUBELLA ANTIBODY, IGM: Rubella: IMMUNE

## 2014-04-01 LAB — OB RESULTS CONSOLE RPR: RPR: NONREACTIVE

## 2014-05-13 ENCOUNTER — Encounter (HOSPITAL_COMMUNITY): Payer: Self-pay | Admitting: *Deleted

## 2014-05-13 ENCOUNTER — Inpatient Hospital Stay (HOSPITAL_COMMUNITY)
Admission: AD | Admit: 2014-05-13 | Discharge: 2014-05-13 | Disposition: A | Discharge status: Home / Self Care | Payer: Medicaid Other | Source: Ambulatory Visit | Attending: Obstetrics and Gynecology | Admitting: Obstetrics and Gynecology

## 2014-05-13 DIAGNOSIS — J029 Acute pharyngitis, unspecified: Secondary | ICD-10-CM | POA: Insufficient documentation

## 2014-05-13 DIAGNOSIS — R05 Cough: Secondary | ICD-10-CM | POA: Insufficient documentation

## 2014-05-13 DIAGNOSIS — O99891 Other specified diseases and conditions complicating pregnancy: Secondary | ICD-10-CM | POA: Insufficient documentation

## 2014-05-13 DIAGNOSIS — O9989 Other specified diseases and conditions complicating pregnancy, childbirth and the puerperium: Principal | ICD-10-CM

## 2014-05-13 DIAGNOSIS — J069 Acute upper respiratory infection, unspecified: Secondary | ICD-10-CM | POA: Insufficient documentation

## 2014-05-13 DIAGNOSIS — R059 Cough, unspecified: Secondary | ICD-10-CM | POA: Insufficient documentation

## 2014-05-13 LAB — URINALYSIS, ROUTINE W REFLEX MICROSCOPIC
Bilirubin Urine: NEGATIVE
Glucose, UA: NEGATIVE mg/dL
HGB URINE DIPSTICK: NEGATIVE
Ketones, ur: NEGATIVE mg/dL
LEUKOCYTES UA: NEGATIVE
Nitrite: NEGATIVE
PROTEIN: NEGATIVE mg/dL
SPECIFIC GRAVITY, URINE: 1.02 (ref 1.005–1.030)
UROBILINOGEN UA: 0.2 mg/dL (ref 0.0–1.0)
pH: 6 (ref 5.0–8.0)

## 2014-05-13 NOTE — MAU Note (Signed)
PT SAYS SHE STARTED  FEELING BAD  YESTERDAY-  SORE THROAT,   TODAY  HAD CHILLS, COUGH - PROD-  YELLOWISH ,   , RUNNY NOSE.   NOT CHECKED TEMP.     NAUSEA- NO VOMINTING.  NO DIARRHEA.   GETS PNC  AT DR Dareen PianoANDERSON.-   ALL OK.   SEEN LAST IN OFFICE ON 5-19,  NEXT APPOINTMENT ON 6-9.   LAST SEX-  YESTERDAY .     HAS NOT TAKEN ANY MEDS.

## 2014-05-13 NOTE — MAU Provider Note (Signed)
Chief Complaint: Sore Throat and Cough   First Provider Initiated Contact with Patient 05/13/14 2130      SUBJECTIVE HPI: Kristy Tucker is a 28 y.o. G5P1031 at 3884w5d by LMP who presents with runny nose, sore throat, nasal congestion and body aches since yesterday. Has not checked her temperature. Mild nausea. No vomiting, diarrhea, headache, neck stiffness or sick contacts. Has not taken any antipyretics. Has not called her OB/GYN. No pregnancy concerns.  Past Medical History  Diagnosis Date  . Asthma    OB History  Gravida Para Term Preterm AB SAB TAB Ectopic Multiple Living  5 1 1  3 1 2   1     # Outcome Date GA Lbr Len/2nd Weight Sex Delivery Anes PTL Lv  5 CUR           4 TRM 10/30/12 747w5d 13:50 / 02:08 3.17 kg (6 lb 15.8 oz) M FCP EPI  Y  3 TAB           2 TAB           1 SAB              Past Surgical History  Procedure Laterality Date  . Bartholin gland cyst excision    . No past surgeries     History   Social History  . Marital Status: Unknown    Spouse Name: N/A    Number of Children: N/A  . Years of Education: N/A   Occupational History  . Not on file.   Social History Main Topics  . Smoking status: Never Smoker   . Smokeless tobacco: Not on file  . Alcohol Use: No  . Drug Use: No  . Sexual Activity: Yes    Birth Control/ Protection: None   Other Topics Concern  . Not on file   Social History Narrative  . No narrative on file   No current facility-administered medications on file prior to encounter.   No current outpatient prescriptions on file prior to encounter.   Allergies  Allergen Reactions  . Latex Rash    Rash if wearing gloves    ROS: Pertinent items in HPI.   OBJECTIVE Blood pressure 128/62, pulse 120, temperature 99.6 F (37.6 C), temperature source Oral, resp. rate 20, height 5\' 8"  (1.727 m), weight 107.276 kg (236 lb 8 oz), unknown if currently breastfeeding. GENERAL: Well-developed, well-nourished female in no acute  distress.  HEENT: Normocephalic. Rhinorrhea, congestion present. Throat with mild erythema, no exudate. Tonsils normal. Sinuses nontender. HEART: normal rate and rhythm. No murmurs rubs or gallops. RESP: normal effort. Clear to auscultation bilaterally. ABDOMEN: Soft, non-tender EXTREMITIES: Nontender, no edema NEURO: Alert and oriented SPECULUM EXAM: Deferred  Fetal heart rate 140 by Doppler.  LAB RESULTS Results for orders placed during the hospital encounter of 05/13/14 (from the past 24 hour(s))  URINALYSIS, ROUTINE W REFLEX MICROSCOPIC     Status: None   Collection Time    05/13/14  9:00 PM      Result Value Ref Range   Color, Urine YELLOW  YELLOW   APPearance CLEAR  CLEAR   Specific Gravity, Urine 1.020  1.005 - 1.030   pH 6.0  5.0 - 8.0   Glucose, UA NEGATIVE  NEGATIVE mg/dL   Hgb urine dipstick NEGATIVE  NEGATIVE   Bilirubin Urine NEGATIVE  NEGATIVE   Ketones, ur NEGATIVE  NEGATIVE mg/dL   Protein, ur NEGATIVE  NEGATIVE mg/dL   Urobilinogen, UA 0.2  0.0 - 1.0 mg/dL  Nitrite NEGATIVE  NEGATIVE   Leukocytes, UA NEGATIVE  NEGATIVE    IMAGING No results found.  MAU COURSE Very low suspicion for influenza do to absence of fever and mild symptoms. Doctor's office or return to maternity admissions for fever greater than 100.4, severe sore throat, worsening cough or shortness of breath.  ASSESSMENT 1. URI (upper respiratory infection)   2. Other current maternal conditions classifiable elsewhere, antepartum     PLAN Discharge home in stable condition. Increase fluids and rest.      Follow-up Information   [facility] with [facility] (As scheduled or as needed if no improvement in 10-14 days)    Contact information:   [address] [place] [address] [phone]      [facility]. (As needed in emergencies)    Contact information:   [address] [id number] [place] [id number] [phone]       Medication List         prenatal multivitamin Tabs tablet  Take 1 tablet by mouth at bedtime.     PROBIOTIC PO  Take 1 capsule by mouth daily.         [employer] Doniesha Landau, CNM 05/13/2014  10:06 PM

## 2014-05-13 NOTE — Discharge Instructions (Signed)
Upper Respiratory Infection, Adult °An upper respiratory infection (URI) is also sometimes known as the common cold. The upper respiratory tract includes the nose, sinuses, throat, trachea, and bronchi. Bronchi are the airways leading to the lungs. Most people improve within 1 week, but symptoms can last up to 2 weeks. A residual cough may last even longer.  °CAUSES °Many different viruses can infect the tissues lining the upper respiratory tract. The tissues become irritated and inflamed and often become very moist. Mucus production is also common. A cold is contagious. You can easily spread the virus to others by oral contact. This includes kissing, sharing a glass, coughing, or sneezing. Touching your mouth or nose and then touching a surface, which is then touched by another person, can also spread the virus. °SYMPTOMS  °Symptoms typically develop 1 to 3 days after you come in contact with a cold virus. Symptoms vary from person to person. They may include: °· Runny nose. °· Sneezing. °· Nasal congestion. °· Sinus irritation. °· Sore throat. °· Loss of voice (laryngitis). °· Cough. °· Fatigue. °· Muscle aches. °· Loss of appetite. °· Headache. °· Low-grade fever. °DIAGNOSIS  °You might diagnose your own cold based on familiar symptoms, since most people get a cold 2 to 3 times a year. Your caregiver can confirm this based on your exam. Most importantly, your caregiver can check that your symptoms are not due to another disease such as strep throat, sinusitis, pneumonia, asthma, or epiglottitis. Blood tests, throat tests, and X-rays are not necessary to diagnose a common cold, but they may sometimes be helpful in excluding other more serious diseases. Your caregiver will decide if any further tests are required. °RISKS AND COMPLICATIONS  °You may be at risk for a more severe case of the common cold if you smoke cigarettes, have chronic heart disease (such as heart failure) or lung disease (such as asthma), or if  you have a weakened immune system. The very young and very old are also at risk for more serious infections. Bacterial sinusitis, middle ear infections, and bacterial pneumonia can complicate the common cold. The common cold can worsen asthma and chronic obstructive pulmonary disease (COPD). Sometimes, these complications can require emergency medical care and may be life-threatening. °PREVENTION  °The best way to protect against getting a cold is to practice good hygiene. Avoid oral or hand contact with people with cold symptoms. Wash your hands often if contact occurs. There is no clear evidence that vitamin C, vitamin E, echinacea, or exercise reduces the chance of developing a cold. However, it is always recommended to get plenty of rest and practice good nutrition. °TREATMENT  °Treatment is directed at relieving symptoms. There is no cure. Antibiotics are not effective, because the infection is caused by a virus, not by bacteria. Treatment may include: °· Increased fluid intake. Sports drinks offer valuable electrolytes, sugars, and fluids. °· Breathing heated mist or steam (vaporizer or shower). °· Eating chicken soup or other clear broths, and maintaining good nutrition. °· Getting plenty of rest. °· Using gargles or lozenges for comfort. °· Controlling fevers with ibuprofen or acetaminophen as directed by your caregiver. °· Increasing usage of your inhaler if you have asthma. °Zinc gel and zinc lozenges, taken in the first 24 hours of the common cold, can shorten the duration and lessen the severity of symptoms. Pain medicines may help with fever, muscle aches, and throat pain. A variety of non-prescription medicines are available to treat congestion and runny nose. Your caregiver   can make recommendations and may suggest nasal or lung inhalers for other symptoms.  HOME CARE INSTRUCTIONS   Only take over-the-counter or prescription medicines for pain, discomfort, or fever as directed by your  caregiver.  Use a warm mist humidifier or inhale steam from a shower to increase air moisture. This may keep secretions moist and make it easier to breathe.  Drink enough water and fluids to keep your urine clear or pale yellow.  Rest as needed.  Return to work when your temperature has returned to normal or as your caregiver advises. You may need to stay home longer to avoid infecting others. You can also use a face mask and careful hand washing to prevent spread of the virus. SEEK MEDICAL CARE IF:   After the first few days, you feel you are getting worse rather than better.  You need your caregiver's advice about medicines to control symptoms.  You develop chills, worsening shortness of breath, or brown or red sputum. These may be signs of pneumonia.  You develop yellow or brown nasal discharge or pain in the face, especially when you bend forward. These may be signs of sinusitis.  You develop a fever, swollen neck glands, pain with swallowing, or white areas in the back of your throat. These may be signs of strep throat. SEEK IMMEDIATE MEDICAL CARE IF:   You have a fever.  You develop severe or persistent headache, ear pain, sinus pain, or chest pain.  You develop wheezing, a prolonged cough, cough up blood, or have a change in your usual mucus (if you have chronic lung disease).  You develop sore muscles or a stiff neck. Document Released: 06/05/2001 Document Revised: 03/03/2012 Document Reviewed: 04/13/2011 [facility] Patient Information 2014 [place], [place].   Antibiotic Nonuse  Your caregiver felt that the infection or problem was not one that would be helped with an antibiotic. Infections may be caused by viruses or bacteria. Only a caregiver can tell which one of these is the likely cause of an illness. A cold is the most common cause of infection in both adults and children. A cold is a virus. Antibiotic treatment will have no effect on a viral infection. Viruses can lead  to many lost days of work caring for sick children and many missed days of school. Children may catch as many as 10 "colds" or "flus" per year during which they can be tearful, cranky, and uncomfortable. The goal of treating a virus is aimed at keeping the ill person comfortable. Antibiotics are medications used to help the body fight bacterial infections. There are relatively few types of bacteria that cause infections but there are hundreds of viruses. While both viruses and bacteria cause infection they are very different types of germs. A viral infection will typically go away by itself within 7 to 10 days. Bacterial infections may spread or get worse without antibiotic treatment. Examples of bacterial infections are:  Sore throats (like strep throat or tonsillitis).  Infection in the lung (pneumonia).  Ear and skin infections. Examples of viral infections are:  Colds or flus.  Most coughs and bronchitis.  Sore throats not caused by Strep.  Runny noses. It is often best not to take an antibiotic when a viral infection is the cause of the problem. Antibiotics can kill off the helpful bacteria that we have inside our body and allow harmful bacteria to start growing. Antibiotics can cause side effects such as allergies, nausea, and diarrhea without helping to improve the symptoms of  the viral infection. Additionally, repeated uses of antibiotics can cause bacteria inside of our body to become resistant. That resistance can be passed onto harmful bacterial. The next time you have an infection it may be harder to treat if antibiotics are used when they are not needed. Not treating with antibiotics allows our own immune system to develop and take care of infections more efficiently. Also, antibiotics will work better for us when they are prescribed for bacterial infections. Treatments for a child that is ill may include:  Give extra fluids throughout the day to stay hydrated.  Get plenty of  rest.  Only give your child over-the-counter or prescription medicines for pain, discomfort, or fever as directed by your caregiver.  The use of a cool mist humidifier may help stuffy noses.  Cold medications if suggested by your caregiver. Your caregiver may decide to start you on an antibiotic if:  The problem you were seen for today continues for a longer length of time than expected.  You develop a secondary bacterial infection. SEEK MEDICAL CARE IF:  Fever lasts longer than 5 days.  Symptoms continue to get worse after 5 to 7 days or become severe.  Difficulty in breathing develops.  Signs of dehydration develop (poor drinking, rare urinating, dark colored urine).  Changes in behavior or worsening tiredness (listlessness or lethargy). Document Released: 02/18/2002 Document Revised: 03/03/2012 Document Reviewed: 08/17/2009 [facility] Patient Information 2014 [place], [place].  Medicines During Pregnancy During pregnancy, there are medicines that are either safe or unsafe to take. Medicines include prescriptions from your caregiver, over-the-counter medicines, topical creams applied to the skin, and all herbal substances. Medicines are put into either Class A, B, C, or D. Class A and B medicines have been shown to be safe in pregnancy. Class C medicines are also considered to be safe in pregnancy, but these medicines should only be used when necessary. Class D medicines should not be used at all in pregnancy. They can be harmful to a baby.  It is best to take as little medicine as possible while pregnant. However, some medicines are necessary to take for the mother and baby's health. Sometimes, it is more dangerous to stop taking certain medicines than to stay on them. This is often the case for people with long-term (chronic) conditions such as asthma, diabetes, or high blood pressure (hypertension). If you are pregnant and have a chronic illness, call your caregiver right away. Bring a  list of your medicines and their doses to your appointments. If you are planning to become pregnant, schedule a doctor's appointment and discuss your medicines with your caregiver. Lastly, write down the phone number to your pharmacist. They can answer questions regarding a medicine's class and safety. They cannot give advice as to whether you should or should not be on a medicine.  SAFE AND UNSAFE MEDICINES There is a long list of medicines that are considered safe in pregnancy. Below is a shorter list. For specific medicines, ask your caregiver.  AllergyMedicines Loratadine, cetirizine, and chlorpheniramine are safe to take. Certain nasal steroid sprays are safe. Talk to your caregiver about specific brands that are safe. Analgesics Acetaminophen and acetaminophen with codeine are safe to take. All other nonsteroidal anti-inflammatory drugs (NSAIDS) are not safe. This includes ibuprofen.  Antacids Many over-the-counter antacids are safe to take. Talk to your caregiver about specific brands that are safe. Famotidine, ranitidine, and lansoprazole are safe. Omepresole is considered safe to take in the second trimester. Antibiotic Medicines There are  several antibiotics to avoid. These include, but are not limited to, tetracyline, quinolones, and sulfa medications. Talk to your caregiver before taking any antibiotic.  Antihistamines Benadryl and Zyrtec Asthma Medicines Most asthma steroid inhalers are safe to take. Talk to your caregiver for specific details. Calcium Calcium supplements are safe to take. Do not take oyster shell calcium.  Cough and Cold Medicines It is safe to take products with guaifenesin or dextromethorphan. Talk to your caregiver about specific brands that are safe. It is not safe to take products that contain aspirin or ibuprofen. Decongestant Medicines Pseudoephedrine-containing products are safe to take in the second and third trimester.  Depression Medicines Talk  about these medicines with your caregiver.  Antidiarrheal Medicines It is safe to take loperamide. Talk to your caregiver about specific brands that are safe. It is not safe to take any antidiarrheal medicine that contains bismuth. Eyedrops Allergy eyedrops should be limited.  Iron It is safe to use certain iron-containing medicines for anemia in pregnancy. They require a prescription.  Antinausea Medicines It is safe to take doxylamine and vitamin B6 as directed. There are other prescription medicines available, if needed.  Sleep aids It is safe to take diphenhydramine and acetaminophen with diphenhydramine.  Steroids Hydrocortisone creams are safe to use as directed. Oral steroids require a prescription. It is not safe to take any hemorrhoid cream with pramoxine or phenylephrine. Stool softener It is safe to take stool softener medicines. Avoid daily or prolonged use of stool softeners. Thyroid Medicine It is important to stay on this thyroid medicine. It needs to be followed by your caregiver.  Vaginal Medicines Your caregiver will prescribe a medicine to you if you have a vaginal infection. Certain antifungal medicines are safe to use if you have a sexually transmitted infection (STI). Talk to your caregiver.  Document Released: 12/10/2005 Document Revised: 03/03/2012 Document Reviewed: 12/11/2011 [facility] Patient Information 2014 [place], [place].

## 2014-08-09 ENCOUNTER — Inpatient Hospital Stay (HOSPITAL_COMMUNITY): Payer: No Typology Code available for payment source

## 2014-08-09 ENCOUNTER — Encounter (HOSPITAL_COMMUNITY): Payer: Self-pay

## 2014-08-09 ENCOUNTER — Inpatient Hospital Stay (HOSPITAL_COMMUNITY)
Admission: AD | Admit: 2014-08-09 | Discharge: 2014-08-09 | Disposition: A | Discharge status: Home / Self Care | Payer: No Typology Code available for payment source | Source: Ambulatory Visit | Attending: Obstetrics and Gynecology | Admitting: Obstetrics and Gynecology

## 2014-08-09 DIAGNOSIS — M545 Low back pain, unspecified: Secondary | ICD-10-CM | POA: Diagnosis not present

## 2014-08-09 DIAGNOSIS — E669 Obesity, unspecified: Secondary | ICD-10-CM

## 2014-08-09 DIAGNOSIS — O99210 Obesity complicating pregnancy, unspecified trimester: Secondary | ICD-10-CM

## 2014-08-09 DIAGNOSIS — O9989 Other specified diseases and conditions complicating pregnancy, childbirth and the puerperium: Principal | ICD-10-CM

## 2014-08-09 DIAGNOSIS — Y92410 Unspecified street and highway as the place of occurrence of the external cause: Secondary | ICD-10-CM | POA: Insufficient documentation

## 2014-08-09 DIAGNOSIS — O9A213 Injury, poisoning and certain other consequences of external causes complicating pregnancy, third trimester: Secondary | ICD-10-CM

## 2014-08-09 DIAGNOSIS — O99891 Other specified diseases and conditions complicating pregnancy: Secondary | ICD-10-CM | POA: Diagnosis not present

## 2014-08-09 LAB — URINALYSIS, ROUTINE W REFLEX MICROSCOPIC
Bilirubin Urine: NEGATIVE
Glucose, UA: NEGATIVE mg/dL
KETONES UR: NEGATIVE mg/dL
LEUKOCYTES UA: NEGATIVE
NITRITE: NEGATIVE
PROTEIN: NEGATIVE mg/dL
Specific Gravity, Urine: 1.01 (ref 1.005–1.030)
UROBILINOGEN UA: 0.2 mg/dL (ref 0.0–1.0)
pH: 6.5 (ref 5.0–8.0)

## 2014-08-09 LAB — URINE MICROSCOPIC-ADD ON
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
## 2014-08-09 NOTE — Discharge Instructions (Signed)
Third Trimester of Pregnancy The third trimester is from week 29 through week 42, months 7 through 9. The third trimester is a time when the fetus is growing rapidly. At the end of the ninth month, the fetus is about 20 inches in length and weighs 6-10 pounds.  BODY CHANGES Your body goes through many changes during pregnancy. The changes vary from woman to woman.   Your weight will continue to increase. You can expect to gain 25-35 pounds (11-16 kg) by the end of the pregnancy.  You may begin to get stretch marks on your hips, abdomen, and breasts.  You may urinate more often because the fetus is moving lower into your pelvis and pressing on your bladder.  You may develop or continue to have heartburn as a result of your pregnancy.  You may develop constipation because certain hormones are causing the muscles that push waste through your intestines to slow down.  You may develop hemorrhoids or swollen, bulging veins (varicose veins).  You may have pelvic pain because of the weight gain and pregnancy hormones relaxing your joints between the bones in your pelvis. Backaches may result from overexertion of the muscles supporting your posture.  You may have changes in your hair. These can include thickening of your hair, rapid growth, and changes in texture. Some women also have hair loss during or after pregnancy, or hair that feels dry or thin. Your hair will most likely return to normal after your baby is born.  Your breasts will continue to grow and be tender. A yellow discharge may leak from your breasts called colostrum.  Your belly button may stick out.  You may feel short of breath because of your expanding uterus.  You may notice the fetus "dropping," or moving lower in your abdomen.  You may have a bloody mucus discharge. This usually occurs a few days to a week before labor begins.  Your cervix becomes thin and soft (effaced) near your due date. WHAT TO EXPECT AT YOUR PRENATAL  EXAMS  You will have prenatal exams every 2 weeks until week 36. Then, you will have weekly prenatal exams. During a routine prenatal visit:  You will be weighed to make sure you and the fetus are growing normally.  Your blood pressure is taken.  Your abdomen will be measured to track your baby's growth.  The fetal heartbeat will be listened to.  Any test results from the previous visit will be discussed.  You may have a cervical check near your due date to see if you have effaced. At around 36 weeks, your caregiver will check your cervix. At the same time, your caregiver will also perform a test on the secretions of the vaginal tissue. This test is to determine if a type of bacteria, Group B streptococcus, is present. Your caregiver will explain this further. Your caregiver may ask you:  What your birth plan is.  How you are feeling.  If you are feeling the baby move.  If you have had any abnormal symptoms, such as leaking fluid, bleeding, severe headaches, or abdominal cramping.  If you have any questions. Other tests or screenings that may be performed during your third trimester include:  Blood tests that check for low iron levels (anemia).  Fetal testing to check the health, activity level, and growth of the fetus. Testing is done if you have certain medical conditions or if there are problems during the pregnancy. FALSE LABOR You may feel small, irregular contractions that   eventually go away. These are called Braxton Hicks contractions, or false labor. Contractions may last for hours, days, or even weeks before true labor sets in. If contractions come at regular intervals, intensify, or become painful, it is best to be seen by your caregiver.  SIGNS OF LABOR   Menstrual-like cramps.  Contractions that are 5 minutes apart or less.  Contractions that start on the top of the uterus and spread down to the lower abdomen and back.  A sense of increased pelvic pressure or back  pain.  A watery or bloody mucus discharge that comes from the vagina. If you have any of these signs before the 37th week of pregnancy, call your caregiver right away. You need to go to the hospital to get checked immediately. HOME CARE INSTRUCTIONS   Avoid all smoking, herbs, alcohol, and unprescribed drugs. These chemicals affect the formation and growth of the baby.  Follow your caregiver's instructions regarding medicine use. There are medicines that are either safe or unsafe to take during pregnancy.  Exercise only as directed by your caregiver. Experiencing uterine cramps is a good sign to stop exercising.  Continue to eat regular, healthy meals.  Wear a good support bra for breast tenderness.  Do not use hot tubs, steam rooms, or saunas.  Wear your seat belt at all times when driving.  Avoid raw meat, uncooked cheese, cat litter boxes, and soil used by cats. These carry germs that can cause birth defects in the baby.  Take your prenatal vitamins.  Try taking a stool softener (if your caregiver approves) if you develop constipation. Eat more high-fiber foods, such as fresh vegetables or fruit and whole grains. Drink plenty of fluids to keep your urine clear or pale yellow.  Take warm sitz baths to soothe any pain or discomfort caused by hemorrhoids. Use hemorrhoid cream if your caregiver approves.  If you develop varicose veins, wear support hose. Elevate your feet for 15 minutes, 3-4 times a day. Limit salt in your diet.  Avoid heavy lifting, wear low heal shoes, and practice good posture.  Rest a lot with your legs elevated if you have leg cramps or low back pain.  Visit your dentist if you have not gone during your pregnancy. Use a soft toothbrush to brush your teeth and be gentle when you floss.  A sexual relationship may be continued unless your caregiver directs you otherwise.  Do not travel far distances unless it is absolutely necessary and only with the approval  of your caregiver.  Take prenatal classes to understand, practice, and ask questions about the labor and delivery.  Make a trial run to the hospital.  Pack your hospital bag.  Prepare the baby's nursery.  Continue to go to all your prenatal visits as directed by your caregiver. SEEK MEDICAL CARE IF:  You are unsure if you are in labor or if your water has broken.  You have dizziness.  You have mild pelvic cramps, pelvic pressure, or nagging pain in your abdominal area.  You have persistent nausea, vomiting, or diarrhea.  You have a bad smelling vaginal discharge.  You have pain with urination. SEEK IMMEDIATE MEDICAL CARE IF:   You have a fever.  You are leaking fluid from your vagina.  You have spotting or bleeding from your vagina.  You have severe abdominal cramping or pain.  You have rapid weight loss or gain.  You have shortness of breath with chest pain.  You notice sudden or extreme swelling   of your face, hands, ankles, feet, or legs.  You have not felt your baby move in over an hour.  You have severe headaches that do not go away with medicine.  You have vision changes. Document Released: 12/04/2001 Document Revised: 12/15/2013 Document Reviewed: 02/10/2013 ExitCare Patient Information 2015 [employer]. This information is not intended to replace advice given to you by your health care provider. Make sure you discuss any questions you have with your health care provider.  

## 2014-09-06 ENCOUNTER — Inpatient Hospital Stay (HOSPITAL_COMMUNITY)
Admission: AD | Admit: 2014-09-06 | Discharge: 2014-09-06 | Disposition: A | Discharge status: Home / Self Care | Payer: Medicaid Other | Source: Ambulatory Visit | Attending: Obstetrics | Admitting: Obstetrics

## 2014-09-06 ENCOUNTER — Encounter (HOSPITAL_COMMUNITY): Payer: Self-pay | Admitting: General Practice

## 2014-09-06 DIAGNOSIS — O4690 Antepartum hemorrhage, unspecified, unspecified trimester: Secondary | ICD-10-CM | POA: Diagnosis present

## 2014-09-06 DIAGNOSIS — O4700 False labor before 37 completed weeks of gestation, unspecified trimester: Secondary | ICD-10-CM | POA: Diagnosis not present

## 2014-09-06 DIAGNOSIS — O4693 Antepartum hemorrhage, unspecified, third trimester: Secondary | ICD-10-CM

## 2014-09-06 LAB — URINALYSIS, ROUTINE W REFLEX MICROSCOPIC
BILIRUBIN URINE: NEGATIVE
GLUCOSE, UA: NEGATIVE mg/dL
Ketones, ur: NEGATIVE mg/dL
Leukocytes, UA: NEGATIVE
Nitrite: NEGATIVE
PH: 8 (ref 5.0–8.0)
Protein, ur: NEGATIVE mg/dL
SPECIFIC GRAVITY, URINE: 1.01 (ref 1.005–1.030)
UROBILINOGEN UA: 0.2 mg/dL (ref 0.0–1.0)

## 2014-09-06 LAB — URINE MICROSCOPIC-ADD ON

## 2014-09-06 NOTE — MAU Note (Signed)
Pt states she was in shower earlier and she started bleeding, dripping bright red blood. Upon arrival to MAU the bleeding is a small amount of bright red blood on a pad. Has some cramping.

## 2014-09-06 NOTE — Discharge Instructions (Signed)
Pelvic Rest °Pelvic rest is sometimes recommended for women when:  °· The placenta is partially or completely covering the opening of the cervix (placenta previa). °· There is bleeding between the uterine wall and the amniotic sac in the first trimester (subchorionic hemorrhage). °· The cervix begins to open without labor starting (incompetent cervix, cervical insufficiency). °· The labor is too early (preterm labor). °HOME CARE INSTRUCTIONS °· Do not have sexual intercourse, stimulation, or an orgasm. °· Do not use tampons, douche, or put anything in the vagina. °· Do not lift anything over 10 pounds (4.5 kg). °· Avoid strenuous activity or straining your pelvic muscles. °SEEK MEDICAL CARE IF:  °· You have any vaginal bleeding during pregnancy. Treat this as a potential emergency. °· You have cramping pain felt low in the stomach (stronger than menstrual cramps). °· You notice vaginal discharge (watery, mucus, or bloody). °· You have a low, dull backache. °· There are regular contractions or uterine tightening. °SEEK IMMEDIATE MEDICAL CARE IF: °You have vaginal bleeding and have placenta previa.  °Document Released: 04/06/2011 Document Revised: 03/03/2012 Document Reviewed: 04/06/2011 °ExitCare® Patient Information ©2015 [employer]. This information is not intended to replace advice given to you by your health care provider. Make sure you discuss any questions you have with your health care provider. ° °Vaginal Bleeding During Pregnancy, Third Trimester °A small amount of bleeding (spotting) from the vagina is relatively common in pregnancy. Various things can cause bleeding or spotting in pregnancy. Sometimes the bleeding is normal and is not a problem. However, bleeding during the third trimester can also be a sign of something serious for the mother and the baby. Be sure to tell your health care provider about any vaginal bleeding right away.  °Some possible causes of vaginal bleeding during the third  trimester include:  °· The placenta may be partially or completely covering the opening to the cervix (placenta previa).   °· The placenta may have separated from the uterus (abruption of the placenta).   °· There may be an infection or growth on the cervix.   °· You may be starting labor, called discharging of the mucus plug.   °· The placenta may grow into the muscle layer of the uterus (placenta accreta).   °HOME CARE INSTRUCTIONS  °Watch your condition for any changes. The following actions may help to lessen any discomfort you are feeling:  °· Follow your health care provider's instructions for limiting your activity. If your health care provider orders bed rest, you may need to stay in bed and only get up to use the bathroom. However, your health care provider may allow you to continue light activity. °· If needed, make plans for someone to help with your regular activities and responsibilities while you are on bed rest. °· Keep track of the number of pads you use each day, how often you change pads, and how soaked (saturated) they are. Write this down. °· Do not use tampons. Do not douche. °· Do not have sexual intercourse or orgasms until approved by your health care provider. °· Follow your health care provider's advice about lifting, driving, and physical activities. °· If you pass any tissue from your vagina, save the tissue so you can show it to your health care provider.   °· Only take over-the-counter or prescription medicines as directed by your health care provider. °· Do not take aspirin because it can make you bleed.   °· Keep all follow-up appointments as directed by your health care provider. °SEEK MEDICAL CARE IF: °·   You have any vaginal bleeding during any part of your pregnancy. °· You have cramps or labor pains. °· You have a fever, not controlled by medicine. °SEEK IMMEDIATE MEDICAL CARE IF:  °· You have severe cramps or pain in your back or belly (abdomen). °· You have chills. °· You have a  gush of fluid from the vagina. °· You pass large clots or tissue from your vagina. °· Your bleeding increases. °· You feel light-headed or weak. °· You pass out. °· You feel less movement or no movement of the baby.   °MAKE SURE YOU: °· Understand these instructions. °· Will watch your condition. °· Will get help right away if you are not doing well or get worse. °Document Released: 03/02/2003 Document Revised: 12/15/2013 Document Reviewed: 08/17/2013 °ExitCare® Patient Information ©2015 [employer]. This information is not intended to replace advice given to you by your health care provider. Make sure you discuss any questions you have with your health care provider. ° °

## 2014-09-06 NOTE — MAU Provider Note (Signed)
  History     CSN: [id number]  Arrival date and time: 09/06/14 1150   First Provider Initiated Contact with Patient 09/06/14 1247      Chief Complaint  Patient presents with  . Vaginal Bleeding   HPI This is a 28 y.o. female at [redacted]w[redacted]d who presents with c/o bleeding once tonight. Has had some cramping with contractions. Bleeding much less now, spot on pad. Good fetal movement.   RN Note: Pt states she was in shower earlier and she started bleeding, dripping bright red blood. Upon arrival to MAU the bleeding is a small amount of bright red blood on a pad. Has some cramping.        OB History   Grav Para Term Preterm Abortions TAB SAB Ect Mult Living   Past Medical History  Diagnosis Date  . Asthma     Past Surgical History  Procedure Laterality Date  . Bartholin gland cyst excision      Family History  Problem Relation Age of Onset  . Hearing loss Mother   . Asthma Brother   . Diabetes Maternal Uncle     History  Substance Use Topics  . Smoking status: Never Smoker   . Smokeless tobacco: Not on file  . Alcohol Use: No    Allergies:  Allergies  Allergen Reactions  . Latex Rash    Rash if wearing gloves    Prescriptions prior to admission  Medication Sig Dispense Refill  . Prenatal Vit-Fe Fumarate-FA (PRENATAL MULTIVITAMIN) TABS tablet Take 1 tablet by mouth at bedtime.        Review of Systems  Constitutional: Negative for fever, chills and malaise/fatigue.  Gastrointestinal: Positive for abdominal pain (occasional cramping). Negative for nausea and vomiting.       Irregular contractions   Physical Exam   Blood pressure 148/79, pulse 107, temperature 98.7 F (37.1 C), temperature source Oral, resp. rate 18, height  (1.727 m), weight 258 lb 3.2 oz (117.119 kg), unknown if currently breastfeeding. Repeat BP 132/75  Physical Exam  Constitutional: She is oriented to person, place, and time. She appears well-developed and  well-nourished. No distress.  Cardiovascular: Normal rate.   Respiratory: Effort normal.  GI: Soft. She exhibits no distension. There is no tenderness. There is no rebound and no guarding.  Musculoskeletal: Normal range of motion.  Neurological: She is alert and oriented to person, place, and time.  Skin: Skin is warm and dry.  Psychiatric: She has a normal mood and affect.   FHR reactive Irregular, occasional contractions Cervix closed/long  Speculum exam:  Small spot of blood on vulva, no blood in vault at all,                                White creamy discharge seen  MAU Course  Procedures  MDM Discussed with Dr Chestine Spore  Assessment and Plan  A:  SIUP at [redacted]w[redacted]d        One episode of bleeding        Reassuring FHR tracing, Category I       Irregular contractions  P:  Discharge home       Pelvic rest       PTL precautions       Followup inoffice  [facility] 09/06/2014, 1:50 PM

## 2014-09-09 NOTE — MAU Provider Note (Signed)
I reviewed the above patient with Artelia Laroche, CNM and agree with the plan of care

## 2014-09-21 LAB — OB RESULTS CONSOLE GBS: STREP GROUP B AG: NEGATIVE

## 2014-09-28 ENCOUNTER — Telehealth (HOSPITAL_COMMUNITY): Payer: Self-pay | Admitting: *Deleted

## 2014-09-28 NOTE — Telephone Encounter (Signed)
Preadmission screen  

## 2014-10-01 ENCOUNTER — Observation Stay (HOSPITAL_COMMUNITY)
Admission: RE | Admit: 2014-10-01 | Discharge: 2014-10-01 | Disposition: A | Discharge status: Home / Self Care | Payer: Medicaid Other | Source: Ambulatory Visit | Attending: Obstetrics | Admitting: Obstetrics

## 2014-10-01 VITALS — BP 127/76 | HR 99 | Temp 97.8°F | Resp 20

## 2014-10-01 DIAGNOSIS — O321XX0 Maternal care for breech presentation, not applicable or unspecified: Principal | ICD-10-CM | POA: Insufficient documentation

## 2014-10-01 DIAGNOSIS — O329XX1 Maternal care for malpresentation of fetus, unspecified, fetus 1: Secondary | ICD-10-CM

## 2014-10-01 DIAGNOSIS — Z3A37 37 weeks gestation of pregnancy: Secondary | ICD-10-CM | POA: Insufficient documentation

## 2014-10-01 DIAGNOSIS — O329XX0 Maternal care for malpresentation of fetus, unspecified, not applicable or unspecified: Secondary | ICD-10-CM | POA: Diagnosis present

## 2014-10-01 MED ORDER — LACTATED RINGERS IV SOLN: 500.0000 mL | INTRAVENOUS | Status: DC | PRN

## 2014-10-01 MED ORDER — TERBUTALINE SULFATE 1 MG/ML IJ SOLN
0.2500 mg | Freq: Once | INTRAMUSCULAR | Status: DC | PRN
  Filled 2014-10-01: qty 1

## 2014-10-01 MED ORDER — LACTATED RINGERS IV SOLN: INTRAVENOUS | Status: DC

## 2014-10-01 NOTE — Discharge Instructions (Signed)
Keep next appointment for October 06, 2014 at 2:00 pm.

## 2014-10-01 NOTE — Progress Notes (Signed)
28 y.o. [id number] @ [id number] presents for ECV due to breech presentation.    On bedside ultrasound today, baby is vertex. Will obtain NST and d/c home to await spontaneous labor.   [place], [facility], IncDYANNA

## 2014-10-15 ENCOUNTER — Other Ambulatory Visit: Payer: Self-pay | Admitting: Obstetrics and Gynecology

## 2014-10-19 ENCOUNTER — Telehealth (HOSPITAL_COMMUNITY): Payer: Self-pay | Admitting: *Deleted

## 2014-10-19 NOTE — Telephone Encounter (Signed)
Preadmission screen  

## 2014-10-23 ENCOUNTER — Encounter (HOSPITAL_COMMUNITY): Payer: Medicaid Other | Admitting: Anesthesiology

## 2014-10-23 ENCOUNTER — Inpatient Hospital Stay (HOSPITAL_COMMUNITY)
Admission: AD | Admit: 2014-10-23 | Discharge: 2014-10-23 | DRG: 775 | Disposition: A | Discharge status: Home / Self Care | Payer: Medicaid Other | Source: Ambulatory Visit | Attending: Obstetrics and Gynecology | Admitting: Obstetrics and Gynecology

## 2014-10-23 ENCOUNTER — Inpatient Hospital Stay (HOSPITAL_COMMUNITY): Payer: Medicaid Other

## 2014-10-23 ENCOUNTER — Encounter (HOSPITAL_COMMUNITY): Payer: Self-pay | Admitting: *Deleted

## 2014-10-23 ENCOUNTER — Inpatient Hospital Stay (HOSPITAL_COMMUNITY): Payer: Medicaid Other | Admitting: Anesthesiology

## 2014-10-23 DIAGNOSIS — Z822 Family history of deafness and hearing loss: Secondary | ICD-10-CM

## 2014-10-23 DIAGNOSIS — Z833 Family history of diabetes mellitus: Secondary | ICD-10-CM | POA: Diagnosis not present

## 2014-10-23 DIAGNOSIS — IMO0002 Reserved for concepts with insufficient information to code with codable children: Secondary | ICD-10-CM | POA: Diagnosis present

## 2014-10-23 DIAGNOSIS — O368131 Decreased fetal movements, third trimester, fetus 1: Secondary | ICD-10-CM

## 2014-10-23 DIAGNOSIS — O480 Post-term pregnancy: Secondary | ICD-10-CM | POA: Diagnosis present

## 2014-10-23 DIAGNOSIS — O368190 Decreased fetal movements, unspecified trimester, not applicable or unspecified: Secondary | ICD-10-CM

## 2014-10-23 DIAGNOSIS — O364XX0 Maternal care for intrauterine death, not applicable or unspecified: Secondary | ICD-10-CM | POA: Diagnosis present

## 2014-10-23 DIAGNOSIS — Z91040 Latex allergy status: Secondary | ICD-10-CM

## 2014-10-23 DIAGNOSIS — O133 Gestational [pregnancy-induced] hypertension without significant proteinuria, third trimester: Secondary | ICD-10-CM | POA: Diagnosis present

## 2014-10-23 DIAGNOSIS — O368130 Decreased fetal movements, third trimester, not applicable or unspecified: Secondary | ICD-10-CM

## 2014-10-23 DIAGNOSIS — Z3A41 41 weeks gestation of pregnancy: Secondary | ICD-10-CM | POA: Diagnosis present

## 2014-10-23 LAB — COMPREHENSIVE METABOLIC PANEL
ALT: 13 U/L (ref 0–35)
AST: 17 U/L (ref 0–37)
Albumin: 2.4 g/dL — ABNORMAL LOW (ref 3.5–5.2)
Alkaline Phosphatase: 147 U/L — ABNORMAL HIGH (ref 39–117)
Anion gap: 12 (ref 5–15)
BUN: 5 mg/dL — ABNORMAL LOW (ref 6–23)
CALCIUM: 9.2 mg/dL (ref 8.4–10.5)
CHLORIDE: 102 meq/L (ref 96–112)
CO2: 23 meq/L (ref 19–32)
CREATININE: 0.46 mg/dL — AB (ref 0.50–1.10)
GFR calc non Af Amer: >90 mL/min (ref >90)
GLUCOSE: 77 mg/dL (ref 70–99)
Potassium: 4.1 mEq/L (ref 3.7–5.3)
Sodium: 137 mEq/L (ref 137–147)
Total Protein: 6 g/dL (ref 6.0–8.3)

## 2014-10-23 LAB — PROTEIN / CREATININE RATIO, URINE
Creatinine, Urine: 36.41 mg/dL
Protein Creatinine Ratio: 0.15 (ref 0.00–0.15)
TOTAL PROTEIN, URINE: 5.6 mg/dL

## 2014-10-23 LAB — RPR

## 2014-10-23 LAB — TYPE AND SCREEN
ABO/RH(D): O POS
Antibody Screen: NEGATIVE

## 2014-10-23 LAB — CBC
HCT: 35.1 % — ABNORMAL LOW (ref 36.0–46.0)
Hemoglobin: 11.7 g/dL — ABNORMAL LOW (ref 12.0–15.0)
MCH: 26 pg (ref 26.0–34.0)
MCHC: 33.3 g/dL (ref 30.0–36.0)
MCV: 78 fL (ref 78.0–100.0)
Platelets: 323 10*3/uL (ref 150–400)
RBC: 4.5 MIL/uL (ref 3.87–5.11)
RDW: 15.1 % (ref 11.5–15.5)
WBC: 13.6 10*3/uL — ABNORMAL HIGH (ref 4.0–10.5)

## 2014-10-23 LAB — SAVE SMEAR

## 2014-10-23 LAB — PROTIME-INR
INR: 1.01 (ref 0.00–1.49)
Prothrombin Time: 13.4 seconds (ref 11.6–15.2)

## 2014-10-23 LAB — APTT: APTT: 28 s (ref 24–37)

## 2014-10-23 LAB — HEMOGLOBIN A1C
Hgb A1c MFr Bld: 5.8 % — ABNORMAL HIGH (ref <5.7)
MEAN PLASMA GLUCOSE: 120 mg/dL — AB (ref <117)

## 2014-10-23 LAB — FIBRINOGEN: Fibrinogen: 565 mg/dL — ABNORMAL HIGH (ref 204–475)

## 2014-10-23 MED ORDER — OXYCODONE-ACETAMINOPHEN 5-325 MG PO TABS: 1.0000 | ORAL_TABLET | ORAL | Status: DC | PRN

## 2014-10-23 MED ORDER — ACETAMINOPHEN 325 MG PO TABS
650.0000 mg | ORAL_TABLET | ORAL | Status: DC | PRN
Start: 2014-10-23 — End: 2014-10-23

## 2014-10-23 MED ORDER — SIMETHICONE 80 MG PO CHEW: 80.0000 mg | CHEWABLE_TABLET | ORAL | Status: DC | PRN

## 2014-10-23 MED ORDER — EPHEDRINE 5 MG/ML INJ: 10.0000 mg | INTRAVENOUS | Status: DC | PRN

## 2014-10-23 MED ORDER — IBUPROFEN 600 MG PO TABS: 600.0000 mg | ORAL_TABLET | Freq: Four times a day (QID) | ORAL | Status: DC | PRN

## 2014-10-23 MED ORDER — FENTANYL 2.5 MCG/ML BUPIVACAINE 1/10 % EPIDURAL INFUSION (WH - ANES)
14.0000 mL/h | INTRAMUSCULAR | Status: DC | PRN
  Administered 2014-10-23: 14 mL/h via EPIDURAL
  Filled 2014-10-23 (×2): qty 125

## 2014-10-23 MED ORDER — LACTATED RINGERS IV SOLN
INTRAVENOUS | Status: DC
  Administered 2014-10-23: 11:00:00 via INTRAVENOUS

## 2014-10-23 MED ORDER — ZOLPIDEM TARTRATE 5 MG PO TABS
5.0000 mg | ORAL_TABLET | Freq: Every evening | ORAL | Status: DC | PRN
Start: 2014-10-23 — End: 2014-10-24

## 2014-10-23 MED ORDER — MISOPROSTOL 25 MCG QUARTER TABLET
100.0000 ug | ORAL_TABLET | ORAL | Status: DC
  Administered 2014-10-23: 100 ug via VAGINAL
  Filled 2014-10-23: qty 1

## 2014-10-23 MED ORDER — OXYTOCIN BOLUS FROM INFUSION: 500.0000 mL | INTRAVENOUS | Status: DC

## 2014-10-23 MED ORDER — WITCH HAZEL-GLYCERIN EX PADS: 1.0000 "application " | MEDICATED_PAD | CUTANEOUS | Status: DC | PRN

## 2014-10-23 MED ORDER — DIBUCAINE 1 % RE OINT: 1.0000 "application " | TOPICAL_OINTMENT | RECTAL | Status: DC | PRN

## 2014-10-23 MED ORDER — BENZOCAINE-MENTHOL 20-0.5 % EX AERO: 1.0000 "application " | INHALATION_SPRAY | CUTANEOUS | Status: DC | PRN

## 2014-10-23 MED ORDER — SENNOSIDES-DOCUSATE SODIUM 8.6-50 MG PO TABS: 2.0000 | ORAL_TABLET | ORAL | Status: DC

## 2014-10-23 MED ORDER — OXYCODONE-ACETAMINOPHEN 5-325 MG PO TABS: 2.0000 | ORAL_TABLET | ORAL | Status: DC | PRN

## 2014-10-23 MED ORDER — CITRIC ACID-SODIUM CITRATE 334-500 MG/5ML PO SOLN: 30.0000 mL | ORAL | Status: DC | PRN

## 2014-10-23 MED ORDER — ONDANSETRON HCL 4 MG/2ML IJ SOLN: 4.0000 mg | Freq: Four times a day (QID) | INTRAMUSCULAR | Status: DC | PRN

## 2014-10-23 MED ORDER — ONDANSETRON HCL 4 MG PO TABS: 4.0000 mg | ORAL_TABLET | ORAL | Status: DC | PRN

## 2014-10-23 MED ORDER — LANOLIN HYDROUS EX OINT: TOPICAL_OINTMENT | CUTANEOUS | Status: DC | PRN

## 2014-10-23 MED ORDER — PRENATAL MULTIVITAMIN CH: 1.0000 | ORAL_TABLET | Freq: Every day | ORAL | Status: DC

## 2014-10-23 MED ORDER — PHENYLEPHRINE 40 MCG/ML (10ML) SYRINGE FOR IV PUSH (FOR BLOOD PRESSURE SUPPORT)
80.0000 ug | PREFILLED_SYRINGE | INTRAVENOUS | Status: DC | PRN
  Filled 2014-10-23: qty 10

## 2014-10-23 MED ORDER — FLEET ENEMA 7-19 GM/118ML RE ENEM: 1.0000 | ENEMA | RECTAL | Status: DC | PRN

## 2014-10-23 MED ORDER — LACTATED RINGERS IV SOLN
500.0000 mL | Freq: Once | INTRAVENOUS | Status: AC
  Administered 2014-10-23: 1000 mL via INTRAVENOUS

## 2014-10-23 MED ORDER — ONDANSETRON HCL 4 MG/2ML IJ SOLN: 4.0000 mg | INTRAMUSCULAR | Status: DC | PRN

## 2014-10-23 MED ORDER — ZOLPIDEM TARTRATE 5 MG PO TABS: 5.0000 mg | ORAL_TABLET | Freq: Every evening | ORAL | Status: DC | PRN

## 2014-10-23 MED ORDER — IBUPROFEN 600 MG PO TABS: 600.0000 mg | ORAL_TABLET | Freq: Four times a day (QID) | ORAL | Status: DC

## 2014-10-23 MED ORDER — TERBUTALINE SULFATE 1 MG/ML IJ SOLN: 0.2500 mg | Freq: Once | INTRAMUSCULAR | Status: DC | PRN

## 2014-10-23 MED ORDER — LACTATED RINGERS IV SOLN: 500.0000 mL | INTRAVENOUS | Status: DC | PRN

## 2014-10-23 MED ORDER — OXYTOCIN 40 UNITS IN LACTATED RINGERS INFUSION - SIMPLE MED
62.5000 mL/h | INTRAVENOUS | Status: DC
  Filled 2014-10-23: qty 1000

## 2014-10-23 MED ORDER — DIPHENHYDRAMINE HCL 25 MG PO CAPS: 25.0000 mg | ORAL_CAPSULE | Freq: Four times a day (QID) | ORAL | Status: DC | PRN

## 2014-10-23 MED ORDER — LIDOCAINE HCL (PF) 1 % IJ SOLN: 30.0000 mL | INTRAMUSCULAR | Status: DC | PRN

## 2014-10-23 MED ORDER — TETANUS-DIPHTH-ACELL PERTUSSIS 5-2.5-18.5 LF-MCG/0.5 IM SUSP: 0.5000 mL | Freq: Once | INTRAMUSCULAR | Status: DC

## 2014-10-23 MED ORDER — LIDOCAINE HCL (PF) 1 % IJ SOLN
INTRAMUSCULAR | Status: DC | PRN
  Administered 2014-10-23 (×4): 4 mL

## 2014-10-23 MED ORDER — OXYTOCIN 40 UNITS IN LACTATED RINGERS INFUSION - SIMPLE MED
1.0000 m[IU]/min | INTRAVENOUS | Status: DC
  Administered 2014-10-23: 2 m[IU]/min via INTRAVENOUS

## 2014-10-23 MED ORDER — DIPHENHYDRAMINE HCL 50 MG/ML IJ SOLN: 12.5000 mg | INTRAMUSCULAR | Status: DC | PRN

## 2014-10-23 MED ORDER — PHENYLEPHRINE 40 MCG/ML (10ML) SYRINGE FOR IV PUSH (FOR BLOOD PRESSURE SUPPORT): 80.0000 ug | PREFILLED_SYRINGE | INTRAVENOUS | Status: DC | PRN

## 2014-10-23 NOTE — Progress Notes (Signed)
Patient was in room with her husband and first child (a son), just shy of 2.  She lost her second child, Gabriella (sp) at 41 weeks and had delivered about an hour before I arrived.  She was tired, seemed in control and holding her baby.  her mind was overwhelmed and awash.  She said very little, shed a tear once in our conversation, which focused on lifting any blame or fault she might feel, preparation for being at home, how to talk to her 27 year old, and the availability of Chaplains at the hospital during the day, even if she were to be discharged (which is slated for tomorrow).    She did authorized a chromosome test for her child while Chaplain was present.  Husband was there also and said very little, but he listened to it all.  Included him in the references to "no fault" and the rest.    Chaplain also verbalized the myriad of thoughts they might be having and the feelings likely to come over the next couple of weeks.  Also discussed the real possibility that they may never know why this tragic event occurred.  The couple is not a church going couple but at least the baby's mother verbalized a belief in God.  Baby Gabriella's grandmother was also in the room, leaving before the Chaplain as she lives in [place].  Father's parents are coming sometime this week.  Sriya Kroeze

## 2014-10-23 NOTE — MAU Note (Signed)
Have not felt FM since Friday afternoon. Had sharp, consistent  pain RLQ Friday am but by time MD had called back the pain was gone. Denies bleeding. Watery, clear d/c for few wks.

## 2014-10-23 NOTE — H&P (Signed)
Kristy GreenerMelinda Tucker is a 28 y.o. female presenting for decreased fetal movement.  28 year old gravida 5 para 1031 at 41 weeks 0 days presents to maternity admissions for evaluation of decreased fetal movement. The patient reports that she has not yet felt the baby move since last night. In maternity admissions fetal heart tones could not be auscultated. Ultrasound was performed which demonstrated no fetal cardiac activity consistent with an intrauterine fetal demise. The patient and her family are understandably distraught.  The patient was last seen in the officeon 10/21/2014. She had a nonstress test performed at that time which was reactive. She had called the office on 10/22/2014 complaining of a few hours of sharp right-sided abdominal pain. However, in the phone call was returned the patient stated she was no longer experiencing this pain. She endorsed active fetal movement and denied leaking of fluid or vaginal bleeding. According to the office chart that phone call was returned at approximately 12:05 in the afternoon  The patient's pregnancy was complicated by frank breech presentation. The patient was scheduled for an external cephalic version, however on presentation for her version it was determined that the fetus had spontaneously turned to vertex. During her appointment in the office on 10/14/2014 she was noted to have an elevated blood pressure of 140/80. Blood work was performed at that time. She was seen in the office again on October 23 for a recheck of her blood pressure. Her blood pressure was normotensive on this date at 128/70. She was scheduled for a postdates induction of labor on 10/24/2014.  History OB History   Grav Para Term Preterm Abortions TAB SAB Ect Mult Living   5 1 1  3 2 1   1      Past Medical History  Diagnosis Date  . Asthma    Past Surgical History  Procedure Laterality Date  . Bartholin gland cyst excision     Family History: family history includes Asthma in  her brother; Diabetes in her maternal uncle; Hearing loss in her mother. Social History:  reports that she has never smoked. She does not have any smokeless tobacco history on file. She reports that she does not drink alcohol or use illicit drugs.   Prenatal Transfer Tool  Maternal Diabetes: No, 1 hour Glucola 75 Genetic Screening: Normal, first trimester screen Down syndrome risk of 1 in 541, screen negative (age risk 1:717), trisomy 5218 risk less than 1 and 5000. Open neural tube defect risk less than 1 and 5000 Maternal Ultrasounds/Referrals: Normal:anatomy ultrasound normal female anatomy, anterior placenta Fetal Ultrasounds or other Referrals:  None Maternal Substance Abuse:  No Significant Maternal Medications:  None Significant Maternal Lab Results:  None Other Comments:  None  ROS: as above  Dilation: 1.5 Effacement (%): 50 Station: -2 Exam by:: Romell Wolden Blood pressure 153/66, pulse 91, temperature 98.6 F (37 C), resp. rate 20, height 5\' 8"  (1.727 m), weight 123.741 kg (272 lb 12.8 oz), SpO2 100.00%, unknown if currently breastfeeding. Exam Physical Exam  Prenatal labs: ABO, Rh: --/--/O POS (10/31 0930) Antibody: NEG (10/31 0930) Rubella: Immune (04/09 0000) RPR: Nonreactive (04/09 0000)  HBsAg: Negative (04/09 0000)  HIV: Non-reactive (04/09 0000)  GBS: Negative (09/29 0000)   Assessment/Plan: 1) admit 2) plan epidural on request 3) intrauterine fetal demise: Lab work for antiphospholipid antibody syndrome, lupus anticoagulant, DIC panel drawn. Have discussed the possibility of autopsy with the patient and her husband. 4) will proceed with induction of labor   Kristy Tucker. 10/23/2014, 1:54 PM

## 2014-10-23 NOTE — Anesthesia Procedure Notes (Signed)
Epidural Patient location during procedure: OB Start time: 10/23/2014 10:21 AM  Staffing Anesthesiologist: Aiyden Lauderback Performed by: anesthesiologist   Preanesthetic Checklist Completed: patient identified, site marked, surgical consent, pre-op evaluation, timeout performed, IV checked, risks and benefits discussed and monitors and equipment checked  Epidural Patient position: sitting Prep: site prepped and draped and DuraPrep Patient monitoring: continuous pulse ox and blood pressure Approach: midline Location: L3-L4 Injection technique: LOR air  Needle:  Needle type: Tuohy  Needle gauge: 17 G Needle length: 9 cm and 9 Needle insertion depth: 7 cm Catheter type: closed end flexible Catheter size: 19 Gauge Catheter at skin depth: 12 cm Test dose: negative  Assessment Events: blood not aspirated, injection not painful, no injection resistance, negative IV test and no paresthesia  Additional Notes Discussed risk of headache, infection, bleeding, nerve injury and failed or incomplete block.  Patient voices understanding and wishes to proceed.  Epidural placed easily on first attempt.  No paresthesia.  Patient tolerated procedure well with no apparent complications.  Jasmine DecemberA> Clark Cuff, MDReason for block:procedure for pain
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
## 2014-10-23 NOTE — Anesthesia Preprocedure Evaluation (Signed)
Anesthesia Evaluation  Patient identified by MRN, date of birth, ID band Patient awake    Reviewed: Allergy & Precautions, H&P , NPO status , Patient's Chart, lab work & pertinent test results, reviewed documented beta blocker date and time   History of Anesthesia Complications Negative for: history of anesthetic complications  Airway Mallampati: II  TM Distance: >3 FB Neck ROM: full    Dental  (+) Teeth Intact   Pulmonary asthma (no inhaler use in 2 years) ,  breath sounds clear to auscultation        Cardiovascular negative cardio ROS  Rhythm:regular Rate:Normal     Neuro/Psych negative neurological ROS  negative psych ROS   GI/Hepatic negative GI ROS, Neg liver ROS,   Endo/Other  Morbid obesity  Renal/GU negative Renal ROS  negative genitourinary   Musculoskeletal   Abdominal   Peds  Hematology negative hematology ROS (+)   Anesthesia Other Findings   Reproductive/Obstetrics (+) Pregnancy (term IUFD)                             Anesthesia Physical Anesthesia Plan  ASA: III  Anesthesia Plan: Epidural   Post-op Pain Management:    Induction:   Airway Management Planned:   Additional Equipment:   Intra-op Plan:   Post-operative Plan:   Informed Consent: I have reviewed the patients History and Physical, chart, labs and discussed the procedure including the risks, benefits and alternatives for the proposed anesthesia with the patient or authorized representative who has indicated his/her understanding and acceptance.     Plan Discussed with:   Anesthesia Plan Comments:         Anesthesia Quick Evaluation

## 2014-10-23 NOTE — Progress Notes (Addendum)
Patient ID: Clyda GreenerMelinda Horen, female   DOB: 1986/05/21, 28 y.o.   MRN: [id number]  S: patient comfortable and appropriate. O: AFVSS CVX 1-2/50/-2, soft A/P: 41 week intrauterine fetal demise Will proceed with induction of labor. Plan of care was discussed with the patient.  Foley catheter placed through the cervix without difficulty. 100 g of Cytotec was also placed vaginally.  Will plan for epidural placement soon

## 2014-10-23 NOTE — MAU Note (Signed)
[place] CNM notified of decreased FM and inability to doppler FHR. Bedside u/s ordered and u/s notified

## 2014-10-24 ENCOUNTER — Inpatient Hospital Stay (HOSPITAL_COMMUNITY): Admission: RE | Admit: 2014-10-24 | Payer: No Typology Code available for payment source | Source: Ambulatory Visit

## 2014-10-24 LAB — CORD BLOOD EVALUATION: NEONATAL ABO/RH: O NEG

## 2014-10-25 ENCOUNTER — Encounter (HOSPITAL_COMMUNITY): Payer: Self-pay | Admitting: *Deleted

## 2014-10-25 NOTE — Anesthesia Postprocedure Evaluation (Signed)
  Anesthesia Post-op Note  Patient stable following vaginal delivery.  

## 2014-10-26 LAB — ANTIPHOSPHOLIPID SYNDROME EVAL, BLD
Anticardiolipin IgA: 7 APL U/mL — ABNORMAL LOW (ref <22)
Anticardiolipin IgG: 4 GPL U/mL — ABNORMAL LOW (ref <23)
Anticardiolipin IgM: 8 MPL U/mL — ABNORMAL LOW (ref <11)
DRVVT: 36.2 s (ref <42.9)
Lupus Anticoagulant: NOT DETECTED
PHOSPHATYDALSERINE, IGG: 1 U/mL (ref <16)
PTT Lupus Anticoagulant: 32.6 secs (ref 28.0–43.0)
Phosphatydalserine, IgA: 7 U/mL (ref <20)
Phosphatydalserine, IgM: 4 U/mL (ref <22)

## 2014-10-26 LAB — CULTURE, BLOOD (SINGLE)

## 2014-10-27 ENCOUNTER — Inpatient Hospital Stay (HOSPITAL_COMMUNITY)
Admission: AD | Admit: 2014-10-27 | Discharge: 2014-10-27 | Disposition: A | Discharge status: Home / Self Care | Payer: Medicaid Other | Source: Ambulatory Visit | Attending: Obstetrics and Gynecology | Admitting: Obstetrics and Gynecology

## 2014-10-27 ENCOUNTER — Encounter (HOSPITAL_COMMUNITY): Payer: Self-pay | Admitting: General Practice

## 2014-10-27 DIAGNOSIS — O9279 Other disorders of lactation: Secondary | ICD-10-CM | POA: Diagnosis not present

## 2014-10-27 DIAGNOSIS — N644 Mastodynia: Secondary | ICD-10-CM | POA: Diagnosis present

## 2014-10-27 NOTE — MAU Provider Note (Signed)
History     CSN: [id number]  Arrival date and time: 10/27/14 1356   None     Chief Complaint  Patient presents with  . Breast Pain   HPI This is a 28 y.o. female who presents with c/o bilateral breast engorgement and pain. States had mastitis with previous child and was worried this was going to become mastitis. Denies fever or chills. Has been using ice packs and cabbage leaves.  Denies other complaints. Grieving appropriately, tearful at times.   RN Note:  Expand All Collapse All   Delivered vag on 10/31. Breasts are really engorged. (baby died) Did not call MD. Has tried pain meds, cabbage leaves and cold compresses. Had mastitis with last preg, afraid of that this time.         OB History    Gravida Para Term Preterm AB TAB SAB Ectopic Multiple Living   5 2 2  3 2 1   1       Past Medical History  Diagnosis Date  . Asthma     Past Surgical History  Procedure Laterality Date  . Bartholin gland cyst excision      Family History  Problem Relation Age of Onset  . Hearing loss Mother   . Asthma Brother   . Diabetes Maternal Uncle     History  Substance Use Topics  . Smoking status: Never Smoker   . Smokeless tobacco: Not on file  . Alcohol Use: No    Allergies:  Allergies  Allergen Reactions  . Latex Rash    Rash if wearing gloves    Prescriptions prior to admission  Medication Sig Dispense Refill Last Dose  . ibuprofen (ADVIL,MOTRIN) 600 MG tablet Take 1 tablet (600 mg total) by mouth every 6 (six) hours as needed. 90 tablet 0   . oxyCODONE-acetaminophen (ROXICET) 5-325 MG per tablet Take 2 tablets by mouth every 4 (four) hours as needed. May take 1-2 tablets every 4-6 hours as needed for pain 30 tablet 0   . Prenatal Vit-Fe Fumarate-FA (PRENATAL MULTIVITAMIN) TABS tablet Take 1 tablet by mouth at bedtime.   09/05/2014 at Unknown time  . zolpidem (AMBIEN) 5 MG tablet Take 1 tablet (5 mg total) by mouth at bedtime as needed for sleep. 30 tablet  0     Review of Systems  Constitutional: Negative for fever, chills and malaise/fatigue.  Eyes: Negative for blurred vision and double vision.  Cardiovascular:       Bilateral breast engorgement and pain   Gastrointestinal: Negative for nausea, vomiting and abdominal pain.  Neurological: Negative for headaches.   Physical Exam   Blood pressure 146/83, pulse 87, temperature 98.3 F (36.8 C), temperature source Oral, resp. rate 18, unknown if currently breastfeeding.  Physical Exam  Constitutional: She is oriented to person, place, and time. She appears well-developed and well-nourished. No distress (no fever).  HENT:  Head: Normocephalic.  Cardiovascular: Normal rate.   Respiratory: Effort normal.  Bilateral breasts engorged One small area on right breast just above areola is pink No obvious abscess or erethema  GI: Soft. She exhibits no distension. There is no tenderness. There is no rebound and no guarding.  Musculoskeletal: Normal range of motion.  Neurological: She is alert and oriented to person, place, and time.  Skin: Skin is warm and dry.  Psychiatric: She has a normal mood and affect.    MAU Course  Procedures  MDM Discussed with Dr Claiborne Billingsallahan. She recommends usual milk suppression techniques and will defer  antibiotics for now since there is no fever. Patient is to call if she develops fever over 100.4, redness or chills.   Assessment and Plan  A:  Postpartum following stillbirth      Bilateral breast engorgement      No evidence of mastitis.  P:  Discharge home       Reviewed cabbage leaves, ice packs and tight bra       Precautions reviewed   [facility] NorthWILLIAMS,MARIE 10/27/2014, 2:50 PM

## 2014-10-27 NOTE — MAU Note (Signed)
Delivered vag on 10/31.  Breasts are really engorged.  (baby died)  Did not call MD.  Has tried pain meds, cabbage leaves and cold compresses.  Had mastitis with last preg, afraid of that this time.

## 2014-10-27 NOTE — Discharge Instructions (Signed)
Mastitis Mastitis is inflammation of the breast tissue. It occurs most often in women who are breastfeeding, but it can also affect other women, and even sometimes men. CAUSES  Mastitis is usually caused by a bacterial infection. Bacteria enter the breast tissue through cuts or openings in the skin. Typically, this occurs with breastfeeding because of cracked or irritated skin. Sometimes, it can occur even when there is no opening in the skin. It can be associated with plugged milk (lactiferous) ducts. Nipple piercing can also lead to mastitis. Also, some forms of breast cancer can cause mastitis. SIGNS AND SYMPTOMS   Swelling, redness, tenderness, and pain in an area of the breast.  Swelling of the glands under the arm on the same side.  Fever. If an infection is allowed to progress, a collection of pus (abscess) may develop. DIAGNOSIS  Your health care provider can usually diagnose mastitis based on your symptoms and a physical exam. Tests may be done to help confirm the diagnosis. These may include:   Removal of pus from the breast by applying pressure to the area. This pus can be examined in the lab to determine which bacteria are present. If an abscess has developed, the fluid in the abscess can be removed with a needle. This can also be used to confirm the diagnosis and determine the bacteria present. In most cases, pus will not be present.  Blood tests to determine if your body is fighting a bacterial infection.  Mammogram or ultrasound tests to rule out other problems or diseases. TREATMENT  Antibiotic medicine is used to treat a bacterial infection. Your health care provider will determine which bacteria are most likely causing the infection and will select an appropriate antibiotic. This is sometimes changed based on the results of tests performed to identify the bacteria, or if there is no response to the antibiotic selected. Antibiotics are usually given by mouth. You may also be  given medicine for pain. Mastitis that occurs with breastfeeding will sometimes go away on its own, so your health care provider may choose to wait 24 hours after first seeing you to decide whether a prescription medicine is needed. HOME CARE INSTRUCTIONS   Only take over-the-counter or prescription medicines for pain, fever, or discomfort as directed by your health care provider.  If your health care provider prescribed an antibiotic, take the medicine as directed. Make sure you finish it even if you start to feel better.  Do not wear a tight or underwire bra. Wear a soft, supportive bra.  Increase your fluid intake, especially if you have a fever.  SEEK MEDICAL CARE IF:   You have pus-like discharge from the breast.  Your symptoms do not improve with the treatment prescribed by your health care provider within 2 days. SEEK IMMEDIATE MEDICAL CARE IF:   Your pain and swelling are getting worse.  You have pain that is not controlled with medicine.  You have a red line extending from the breast toward your armpit.  You have a fever or persistent symptoms for more than 2-3 days.  You have a fever and your symptoms suddenly get worse.   Document Released: 12/10/2005 Document Revised: 12/15/2013 Document Reviewed: 07/10/2013 [facility] Patient Information 2015 [place], [place]. This information is not intended to replace advice given to you by your health care provider. Make sure you discuss any questions you have with your health care provider.

## 2014-10-28 ENCOUNTER — Inpatient Hospital Stay (HOSPITAL_COMMUNITY)
Admission: AD | Admit: 2014-10-28 | Discharge: 2014-10-29 | Disposition: A | Discharge status: Home / Self Care | Payer: Medicaid Other | Source: Ambulatory Visit | Attending: Obstetrics and Gynecology | Admitting: Obstetrics and Gynecology

## 2014-10-28 ENCOUNTER — Encounter (HOSPITAL_COMMUNITY): Payer: Self-pay | Admitting: *Deleted

## 2014-10-28 DIAGNOSIS — R030 Elevated blood-pressure reading, without diagnosis of hypertension: Secondary | ICD-10-CM

## 2014-10-28 DIAGNOSIS — O9089 Other complications of the puerperium, not elsewhere classified: Secondary | ICD-10-CM

## 2014-10-28 DIAGNOSIS — I10 Essential (primary) hypertension: Secondary | ICD-10-CM | POA: Insufficient documentation

## 2014-10-28 DIAGNOSIS — R51 Headache: Secondary | ICD-10-CM | POA: Diagnosis not present

## 2014-10-28 DIAGNOSIS — R519 Headache, unspecified: Secondary | ICD-10-CM

## 2014-10-28 DIAGNOSIS — IMO0001 Reserved for inherently not codable concepts without codable children: Secondary | ICD-10-CM

## 2014-10-28 DIAGNOSIS — O909 Complication of the puerperium, unspecified: Secondary | ICD-10-CM | POA: Diagnosis not present

## 2014-10-28 LAB — URIC ACID: Uric Acid, Serum: 4.6 mg/dL (ref 2.4–7.0)

## 2014-10-28 LAB — COMPREHENSIVE METABOLIC PANEL
ALT: 37 U/L — ABNORMAL HIGH (ref 0–35)
AST: 29 U/L (ref 0–37)
Albumin: 2.6 g/dL — ABNORMAL LOW (ref 3.5–5.2)
Alkaline Phosphatase: 123 U/L — ABNORMAL HIGH (ref 39–117)
Anion gap: 10 (ref 5–15)
BUN: 8 mg/dL (ref 6–23)
CHLORIDE: 103 meq/L (ref 96–112)
CO2: 27 meq/L (ref 19–32)
Calcium: 9.3 mg/dL (ref 8.4–10.5)
Creatinine, Ser: 0.61 mg/dL (ref 0.50–1.10)
GLUCOSE: 77 mg/dL (ref 70–99)
Potassium: 4.2 mEq/L (ref 3.7–5.3)
SODIUM: 140 meq/L (ref 137–147)
Total Bilirubin: <0.2 mg/dL — ABNORMAL LOW (ref 0.3–1.2)
Total Protein: 6 g/dL (ref 6.0–8.3)

## 2014-10-28 LAB — CBC
HCT: 33.1 % — ABNORMAL LOW (ref 36.0–46.0)
Hemoglobin: 10.8 g/dL — ABNORMAL LOW (ref 12.0–15.0)
MCH: 25.8 pg — ABNORMAL LOW (ref 26.0–34.0)
MCHC: 32.6 g/dL (ref 30.0–36.0)
MCV: 79.2 fL (ref 78.0–100.0)
Platelets: 336 10*3/uL (ref 150–400)
RBC: 4.18 MIL/uL (ref 3.87–5.11)
RDW: 15.6 % — ABNORMAL HIGH (ref 11.5–15.5)
WBC: 9.7 10*3/uL (ref 4.0–10.5)

## 2014-10-28 LAB — URINALYSIS, ROUTINE W REFLEX MICROSCOPIC
Bilirubin Urine: NEGATIVE
Glucose, UA: NEGATIVE mg/dL
Ketones, ur: NEGATIVE mg/dL
NITRITE: NEGATIVE
PH: 6.5 (ref 5.0–8.0)
Protein, ur: NEGATIVE mg/dL
SPECIFIC GRAVITY, URINE: 1.02 (ref 1.005–1.030)
Urobilinogen, UA: 0.2 mg/dL (ref 0.0–1.0)

## 2014-10-28 LAB — PROTEIN / CREATININE RATIO, URINE
Creatinine, Urine: 103.89 mg/dL
Protein Creatinine Ratio: 0.07 (ref 0.00–0.15)
Total Protein, Urine: 7.4 mg/dL

## 2014-10-28 LAB — URINE MICROSCOPIC-ADD ON

## 2014-10-28 LAB — LACTATE DEHYDROGENASE: LDH: 174 U/L (ref 94–250)

## 2014-10-28 NOTE — MAU Provider Note (Signed)
History     CSN: [id number]  Arrival date and time: 10/28/14 2118   First Provider Initiated Contact with Patient 10/28/14 2157      No chief complaint on file.  HPI Comments: Kristy Tucker 28 y.o. [id number] presents to MAU with postpartum elevated blood pressure, headache and blurred vision. She delivered a stillborn on 10/23/14. Tonight [facility] went to [facility] and took her BP which was 149/98. She also had Headache and took Motrin and Oxycodone which brought pain down to "4". She has some blurred vision that she says is getting better. She also has some swelling in her feet since delivery.       Past Medical History  Diagnosis Date  . Asthma     Past Surgical History  Procedure Laterality Date  . Bartholin gland cyst excision      Family History  Problem Relation Age of Onset  . Hearing loss Mother   . Asthma Brother   . Diabetes Maternal Uncle     History  Substance Use Topics  . Smoking status: Never Smoker   . Smokeless tobacco: Not on file  . Alcohol Use: No    Allergies:  Allergies  Allergen Reactions  . Latex Rash    Rash if wearing gloves    Prescriptions prior to admission  Medication Sig Dispense Refill Last Dose  . ibuprofen (ADVIL,MOTRIN) 600 MG tablet Take 1 tablet (600 mg total) by mouth every 6 (six) hours as needed. 90 tablet 0   . oxyCODONE-acetaminophen (ROXICET) 5-325 MG per tablet Take 2 tablets by mouth every 4 (four) hours as needed. May take 1-2 tablets every 4-6 hours as needed for pain 30 tablet 0   . Prenatal Vit-Fe Fumarate-FA (PRENATAL MULTIVITAMIN) TABS tablet Take 1 tablet by mouth at bedtime.   09/05/2014 at Unknown time  . zolpidem (AMBIEN) 5 MG tablet Take 1 tablet (5 mg total) by mouth at bedtime as needed for sleep. 30 tablet 0     Review of Systems  Constitutional: Negative.   Eyes: Positive for blurred vision.  Gastrointestinal: Negative for abdominal pain.  Genitourinary: Negative.   Musculoskeletal: Negative.        Swelling  in feet  Skin: Negative.   Neurological: Positive for headaches.  Psychiatric/Behavioral: Negative.    Physical Exam   Blood pressure 141/90, pulse 78, temperature 98.4 F (36.9 C), temperature source Oral, resp. rate 16, height 5\' 8"  (1.727 m), weight 119.296 kg (263 lb), SpO2 100 %, not currently breastfeeding.  Physical Exam  Constitutional: She is oriented to person, place, and time. She appears well-developed and well-nourished. No distress.  HENT:  Head: Normocephalic and atraumatic.  Eyes: EOM are normal. Pupils are equal, round, and reactive to light.  Cardiovascular: Normal rate and regular rhythm.   Respiratory: Effort normal and breath sounds normal.  GI: Soft. Bowel sounds are normal. She exhibits no distension. There is no tenderness. There is no rebound and no guarding.  Genitourinary:  Not examined  Musculoskeletal: She exhibits edema.  +2 pitting edema  Neurological: She is alert and oriented to person, place, and time.  Skin: Skin is warm and dry.  Psychiatric: She has a normal mood and affect. Her behavior is normal. Judgment and thought content normal.   Results for orders placed or performed during the hospital encounter of 10/28/14 (from the past 24 hour(s))  Urinalysis, Routine w reflex microscopic     Status: Abnormal   Collection Time: 10/28/14  9:40 PM  Result Value Ref  Range   Color, Urine YELLOW YELLOW   APPearance CLEAR CLEAR   Specific Gravity, Urine 1.020 1.005 - 1.030   pH 6.5 5.0 - 8.0   Glucose, UA NEGATIVE NEGATIVE mg/dL   Hgb urine dipstick MODERATE (A) NEGATIVE   Bilirubin Urine NEGATIVE NEGATIVE   Ketones, ur NEGATIVE NEGATIVE mg/dL   Protein, ur NEGATIVE NEGATIVE mg/dL   Urobilinogen, UA 0.2 0.0 - 1.0 mg/dL   Nitrite NEGATIVE NEGATIVE   Leukocytes, UA SMALL (A) NEGATIVE  Protein / creatinine ratio, urine     Status: None   Collection Time: 10/28/14  9:40 PM  Result Value Ref Range   Creatinine, Urine 103.89 mg/dL   Total Protein,  Urine 7.4 mg/dL   Protein Creatinine Ratio 0.07 0.00 - 0.15  Urine microscopic-add on     Status: None   Collection Time: 10/28/14  9:40 PM  Result Value Ref Range   Squamous Epithelial / LPF RARE RARE   WBC, UA 0-2 <3 WBC/hpf   RBC / HPF 3-6 <3 RBC/hpf   Bacteria, UA RARE RARE  CBC     Status: Abnormal   Collection Time: 10/28/14 10:45 PM  Result Value Ref Range   WBC 9.7 4.0 - 10.5 K/uL   RBC 4.18 3.87 - 5.11 MIL/uL   Hemoglobin 10.8 (L) 12.0 - 15.0 g/dL   HCT 16.133.1 (L) 09.636.0 - 04.546.0 %   MCV 79.2 78.0 - 100.0 fL   MCH 25.8 (L) 26.0 - 34.0 pg   MCHC 32.6 30.0 - 36.0 g/dL   RDW 40.915.6 (H) 81.111.5 - 91.415.5 %   Platelets 336 150 - 400 K/uL  Comprehensive metabolic panel     Status: Abnormal   Collection Time: 10/28/14 10:45 PM  Result Value Ref Range   Sodium 140 137 - 147 mEq/L   Potassium 4.2 3.7 - 5.3 mEq/L   Chloride 103 96 - 112 mEq/L   CO2 27 19 - 32 mEq/L   Glucose, Bld 77 70 - 99 mg/dL   BUN 8 6 - 23 mg/dL   Creatinine, Ser 7.820.61 0.50 - 1.10 mg/dL   Calcium 9.3 8.4 - 95.610.5 mg/dL   Total Protein 6.0 6.0 - 8.3 g/dL   Albumin 2.6 (L) 3.5 - 5.2 g/dL   AST 29 0 - 37 U/L   ALT 37 (H) 0 - 35 U/L   Alkaline Phosphatase 123 (H) 39 - 117 U/L   Total Bilirubin <0.2 (L) 0.3 - 1.2 mg/dL   GFR calc non Af Amer >90 >90 mL/min   GFR calc Af Amer >90 >90 mL/min   Anion gap 10 5 - 15  Uric acid     Status: None   Collection Time: 10/28/14 10:45 PM  Result Value Ref Range   Uric Acid, Serum 4.6 2.4 - 7.0 mg/dL  Lactate dehydrogenase     Status: None   Collection Time: 10/28/14 10:45 PM  Result Value Ref Range   LDH 174 94 - 250 U/L    MAU Course  Procedures  MDM  Spoke with Dr Henderson CloudHorvath who advised PIH labs and if they were normal she could go home  Assessment and Plan   A: Elevated BP Postpartum Headache  P: PIH labs were negative Advised to rest/ fluids/ follow up with Dr Tenny Crawoss May return to MAU as needed  Carolynn ServeBarefoot, Carla Rashad Miller 10/28/2014, 10:03 PM

## 2014-10-28 NOTE — MAU Note (Signed)
Pt reports she [facility] and [facility] was 149/98, pt had SVD of stillborn infant on 10/31. Also reports headache and blurred vision today.

## 2014-10-29 NOTE — Discharge Instructions (Signed)

## 2014-11-02 NOTE — Discharge Summary (Signed)
OB Discharge  DX: 41 week intrauterine fetal demise of unknown etiology   Hospital Course: Kristy Tucker was admitted to L&D for IOL after she was evaluated in MAU for decreased FM overnight and fetal heart tones could not be auscultated. An ultrasound confirmed the diagnosis of an IUFD. IOL was unremarkable. Pt received an epidural for pain management. Amniotomy demonstrated thick meconium. A spontaneous vaginal delivery of a stillborn female infant  On 10/23/2014. Tissue samples from the infants achilles, placenta, and umbilical cord were obtained. Cord blood was collected for culture.  Recent Results (from the past 2160 hour(s))  Urinalysis, Routine w reflex microscopic     Status: Abnormal   Collection Time: 08/09/14  6:58 PM  Result Value Ref Range   Color, Urine YELLOW YELLOW   APPearance CLEAR CLEAR   Specific Gravity, Urine 1.010 1.005 - 1.030   pH 6.5 5.0 - 8.0   Glucose, UA NEGATIVE NEGATIVE mg/dL   Hgb urine dipstick TRACE (A) NEGATIVE   Bilirubin Urine NEGATIVE NEGATIVE   Ketones, ur NEGATIVE NEGATIVE mg/dL   Protein, ur NEGATIVE NEGATIVE mg/dL   Urobilinogen, UA 0.2 0.0 - 1.0 mg/dL   Nitrite NEGATIVE NEGATIVE   Leukocytes, UA NEGATIVE NEGATIVE  Urine microscopic-add on     Status: None   Collection Time: 08/09/14  6:58 PM  Result Value Ref Range   Squamous Epithelial / LPF RARE RARE   WBC, UA 0-2 <3 WBC/hpf   RBC / HPF 0-2 <3 RBC/hpf   Bacteria, UA RARE RARE   Urine-Other MUCOUS PRESENT   Urinalysis, Routine w reflex microscopic     Status: Abnormal   Collection Time: 09/06/14 11:55 AM  Result Value Ref Range   Color, Urine YELLOW YELLOW   APPearance CLEAR CLEAR   Specific Gravity, Urine 1.010 1.005 - 1.030   pH 8.0 5.0 - 8.0   Glucose, UA NEGATIVE NEGATIVE mg/dL   Hgb urine dipstick LARGE (A) NEGATIVE   Bilirubin Urine NEGATIVE NEGATIVE   Ketones, ur NEGATIVE NEGATIVE mg/dL   Protein, ur NEGATIVE NEGATIVE mg/dL   Urobilinogen, UA 0.2 0.0 - 1.0 mg/dL   Nitrite  NEGATIVE NEGATIVE   Leukocytes, UA NEGATIVE NEGATIVE  Urine microscopic-add on     Status: Abnormal   Collection Time: 09/06/14 11:55 AM  Result Value Ref Range   Squamous Epithelial / LPF FEW (A) RARE   WBC, UA 0-2 <3 WBC/hpf   RBC / HPF 3-6 <3 RBC/hpf   Bacteria, UA FEW (A) RARE  OB RESULT CONSOLE Group B Strep     Status: None   Collection Time: 09/21/14 12:00 AM  Result Value Ref Range   GBS Negative   Comprehensive metabolic panel     Status: Abnormal   Collection Time: 10/23/14  9:30 AM  Result Value Ref Range   Sodium 137 137 - 147 mEq/L   Potassium 4.1 3.7 - 5.3 mEq/L   Chloride 102 96 - 112 mEq/L   CO2 23 19 - 32 mEq/L   Glucose, Bld 77 70 - 99 mg/dL   BUN 5 (L) 6 - 23 mg/dL   Creatinine, Ser 0.46 (L) 0.50 - 1.10 mg/dL   Calcium 9.2 8.4 - 10.5 mg/dL   Total Protein 6.0 6.0 - 8.3 g/dL   Albumin 2.4 (L) 3.5 - 5.2 g/dL   AST 17 0 - 37 U/L   ALT 13 0 - 35 U/L   Alkaline Phosphatase 147 (H) 39 - 117 U/L   Total Bilirubin <0.2 (L) 0.3 -  1.2 mg/dL   GFR calc non Af Amer >90 >90 mL/min   GFR calc Af Amer >90 >90 mL/min    Comment: (NOTE) The eGFR has been calculated using the CKD EPI equation. This calculation has not been validated in all clinical situations. eGFR's persistently <90 mL/min signify possible Chronic Kidney Disease.   Anion gap 12 5 - 15  CBC     Status: Abnormal   Collection Time: 10/23/14  9:30 AM  Result Value Ref Range   WBC 13.6 (H) 4.0 - 10.5 K/uL   RBC 4.50 3.87 - 5.11 MIL/uL   Hemoglobin 11.7 (L) 12.0 - 15.0 g/dL   HCT 35.1 (L) 36.0 - 46.0 %   MCV 78.0 78.0 - 100.0 fL   MCH 26.0 26.0 - 34.0 pg   MCHC 33.3 30.0 - 36.0 g/dL   RDW 15.1 11.5 - 15.5 %   Platelets 323 150 - 400 K/uL  RPR     Status: None   Collection Time: 10/23/14  9:30 AM  Result Value Ref Range   RPR NON REAC NON REAC    Comment: Performed at [facility]  Antiphospholipid syndrome eval, bld     Status: Abnormal   Collection Time: 10/23/14  9:30 AM  Result Value  Ref Range   Anticardiolipin IgA 7 (L) <22 APL U/mL    Comment: (NOTE) Reference Range:  Cardiolipin IgG   Normal                  <23   Low Positive (+)        23-35   Moderate Positive (+)   36-50   High Positive (+)       >50 Reference Range:  Cardiolipin IgM   Normal                  <11   Low Positive (+)        11-20   Moderate Positive (+)   21-30   High Positive (+)       >30 Reference Range:  Cardiolipin IgA   Normal                  <22   Low Positive (+)        22-35   Moderate Positive (+)   36-45   High Positive (+)       >45    Anticardiolipin IgG 4 (L) <23 GPL U/mL   Anticardiolipin IgM 8 (L) <11 MPL U/mL   PTT Lupus Anticoagulant 32.6 28.0 - 43.0 secs   PTTLA Confirmation NOT APPL <8.0 secs   PTTLA 4:1 Mix NOT APPL 28.0 - 43.0 secs   DRVVT 36.2 <42.9 secs   Drvvt confirmation NOT APPL <1.15 Ratio   dRVVT Incubated 1:1 Mix NOT APPL <42.9 secs   Lupus Anticoagulant NOT DETECTED NOT DETECTED   Phosphatydalserine, IgG 1 <16 U/mL   Phosphatydalserine, IgM 4 <22 U/mL   Phosphatydalserine, IgA 7 <20 U/mL    Comment: (NOTE) The Antiphospholipid Syndrome (APS) is a clinical-pathologic correlation that includes a clinical event (e.g. thrombosis, pregnancy loss, thrombocytopenia) and persistent positive Antiphospholipid Antibodies (IgM or IgG ACA >40 MPL / GPL, IgM or IgG anti-B2GPI antibodies, or a Lupus Anticoagulant). The IgA isotype has been implicated in smaller studies, but has not yet been incorporated into the APS criteria. Reference J Thromb Haemost 2006: 4; 295 Performed at [facility]   Hemoglobin A1c     Status: Abnormal  Collection Time: 10/23/14  9:30 AM  Result Value Ref Range   Hgb A1c MFr Bld 5.8 (H) <5.7 %    Comment: (NOTE)                                                                       According to the ADA Clinical Practice Recommendations for 2011, when HbA1c is used as a screening test:  >=6.5%   Diagnostic of Diabetes  [place]           (if abnormal result is confirmed) 5.7-6.4%   Increased risk of developing Diabetes Mellitus References:Diagnosis and Classification of Diabetes Mellitus,Diabetes VOHY,0737,10(GYIRS 1):S62-S69 and Standards of Medical Care in         Diabetes - 2011,Diabetes Care,2011,34 (Suppl 1):S11-S61.   Mean Plasma Glucose 120 (H) <117 mg/dL    Comment: Performed at [facility]     Status: None   Collection Time: 10/23/14  9:30 AM  Result Value Ref Range   Prothrombin Time 13.4 11.6 - 15.2 seconds   INR 1.01 0.00 - 1.49  APTT     Status: None   Collection Time: 10/23/14  9:30 AM  Result Value Ref Range   aPTT 28 24 - 37 seconds  Fibrinogen     Status: Abnormal   Collection Time: 10/23/14  9:30 AM  Result Value Ref Range   Fibrinogen 565 (H) 204 - 475 mg/dL  Save smear     Status: None   Collection Time: 10/23/14  9:30 AM  Result Value Ref Range   Smear Review SMEAR STAINED AND AVAILABLE FOR REVIEW   Type and screen     Status: None   Collection Time: 10/23/14  9:30 AM  Result Value Ref Range   ABO/RH(D) O POS    Antibody Screen NEG    Sample Expiration 10/26/2014   Protein / creatinine ratio, urine     Status: None   Collection Time: 10/23/14 12:50 PM  Result Value Ref Range   Creatinine, Urine 36.41 mg/dL   Total Protein, Urine 5.6 mg/dL    Comment: NO NORMAL RANGE ESTABLISHED FOR THIS TEST   Protein Creatinine Ratio 0.15 0.00 - 0.15  Cord Blood (ABO/Rh+DAT)     Status: None   Collection Time: 10/23/14  5:45 PM  Result Value Ref Range   Neonatal ABO/RH O NEG   Culture, blood (single)     Status: None   Collection Time: 10/23/14  5:45 PM  Result Value Ref Range   Specimen Description Peripheral    Special Requests NONE    Culture  Setup Time      10/24/2014 04:11 Performed at [facility] STREPTOCOCCUS Note: Gram Stain Report Called to,Read Back By and Verified With: [facility] Fifi Schindler  10/25/14 1010 BY MARHE Performed at [facility]    Report Status 10/26/2014 FINAL    Organism ID, Bacteria ENTEROCOCCUS SPECIES       Susceptibility   Enterococcus species - MIC*    AMPICILLIN <=2 SENSITIVE Sensitive     VANCOMYCIN 1 SENSITIVE Sensitive     * ENTEROCOCCUS SPECIES  Urinalysis, Routine w reflex microscopic     Status: Abnormal  Collection Time: 10/28/14  9:40 PM  Result Value Ref Range   Color, Urine YELLOW YELLOW   APPearance CLEAR CLEAR   Specific Gravity, Urine 1.020 1.005 - 1.030   pH 6.5 5.0 - 8.0   Glucose, UA NEGATIVE NEGATIVE mg/dL   Hgb urine dipstick MODERATE (A) NEGATIVE   Bilirubin Urine NEGATIVE NEGATIVE   Ketones, ur NEGATIVE NEGATIVE mg/dL   Protein, ur NEGATIVE NEGATIVE mg/dL   Urobilinogen, UA 0.2 0.0 - 1.0 mg/dL   Nitrite NEGATIVE NEGATIVE   Leukocytes, UA SMALL (A) NEGATIVE  Protein / creatinine ratio, urine     Status: None   Collection Time: 10/28/14  9:40 PM  Result Value Ref Range   Creatinine, Urine 103.89 mg/dL   Total Protein, Urine 7.4 mg/dL    Comment: NO NORMAL RANGE ESTABLISHED FOR THIS TEST   Protein Creatinine Ratio 0.07 0.00 - 0.15  Urine microscopic-add on     Status: None   Collection Time: 10/28/14  9:40 PM  Result Value Ref Range   Squamous Epithelial / LPF RARE RARE   WBC, UA 0-2 <3 WBC/hpf   RBC / HPF 3-6 <3 RBC/hpf   Bacteria, UA RARE RARE  CBC     Status: Abnormal   Collection Time: 10/28/14 10:45 PM  Result Value Ref Range   WBC 9.7 4.0 - 10.5 K/uL   RBC 4.18 3.87 - 5.11 MIL/uL   Hemoglobin 10.8 (L) 12.0 - 15.0 g/dL   HCT 33.1 (L) 36.0 - 46.0 %   MCV 79.2 78.0 - 100.0 fL   MCH 25.8 (L) 26.0 - 34.0 pg   MCHC 32.6 30.0 - 36.0 g/dL   RDW 15.6 (H) 11.5 - 15.5 %   Platelets 336 150 - 400 K/uL  Comprehensive metabolic panel     Status: Abnormal   Collection Time: 10/28/14 10:45 PM  Result Value Ref Range   Sodium 140 137 - 147 mEq/L   Potassium 4.2 3.7 - 5.3 mEq/L   Chloride 103 96 - 112 mEq/L   CO2  27 19 - 32 mEq/L   Glucose, Bld 77 70 - 99 mg/dL   BUN 8 6 - 23 mg/dL   Creatinine, Ser 0.61 0.50 - 1.10 mg/dL   Calcium 9.3 8.4 - 10.5 mg/dL   Total Protein 6.0 6.0 - 8.3 g/dL   Albumin 2.6 (L) 3.5 - 5.2 g/dL   AST 29 0 - 37 U/L   ALT 37 (H) 0 - 35 U/L   Alkaline Phosphatase 123 (H) 39 - 117 U/L   Total Bilirubin <0.2 (L) 0.3 - 1.2 mg/dL   GFR calc non Af Amer >90 >90 mL/min   GFR calc Af Amer >90 >90 mL/min    Comment: (NOTE) The eGFR has been calculated using the CKD EPI equation. This calculation has not been validated in all clinical situations. eGFR's persistently <90 mL/min signify possible Chronic Kidney Disease.    Anion gap 10 5 - 15  Uric acid     Status: None   Collection Time: 10/28/14 10:45 PM  Result Value Ref Range   Uric Acid, Serum 4.6 2.4 - 7.0 mg/dL  Lactate dehydrogenase     Status: None   Collection Time: 10/28/14 10:45 PM  Result Value Ref Range   LDH 174 94 - 250 U/L

## 2014-11-12 LAB — CHROMOSOME STD, POC(TISSUE)-NCBH

## 2014-11-12 LAB — TISSUE HYBRIDIZATION TO NCBH

## 2015-02-28 ENCOUNTER — Encounter (HOSPITAL_COMMUNITY): Payer: Self-pay | Admitting: *Deleted

## 2015-02-28 ENCOUNTER — Inpatient Hospital Stay (HOSPITAL_COMMUNITY)
Admission: AD | Admit: 2015-02-28 | Discharge: 2015-03-01 | Disposition: A | Discharge status: Home / Self Care | Payer: Medicaid Other | Source: Ambulatory Visit | Attending: Obstetrics & Gynecology | Admitting: Obstetrics & Gynecology

## 2015-02-28 ENCOUNTER — Inpatient Hospital Stay (HOSPITAL_COMMUNITY): Payer: Medicaid Other

## 2015-02-28 DIAGNOSIS — O418X10 Other specified disorders of amniotic fluid and membranes, first trimester, not applicable or unspecified: Secondary | ICD-10-CM

## 2015-02-28 DIAGNOSIS — O468X1 Other antepartum hemorrhage, first trimester: Secondary | ICD-10-CM

## 2015-02-28 DIAGNOSIS — O209 Hemorrhage in early pregnancy, unspecified: Secondary | ICD-10-CM

## 2015-02-28 DIAGNOSIS — O208 Other hemorrhage in early pregnancy: Secondary | ICD-10-CM | POA: Diagnosis not present

## 2015-02-28 DIAGNOSIS — Z3A01 Less than 8 weeks gestation of pregnancy: Secondary | ICD-10-CM | POA: Insufficient documentation

## 2015-02-28 LAB — CBC WITH DIFFERENTIAL/PLATELET
Basophils Absolute: 0.1 10*3/uL (ref 0.0–0.1)
Basophils Relative: 1 % (ref 0–1)
EOS PCT: 4 % (ref 0–5)
Eosinophils Absolute: 0.4 10*3/uL (ref 0.0–0.7)
HCT: 34.9 % — ABNORMAL LOW (ref 36.0–46.0)
Hemoglobin: 11.4 g/dL — ABNORMAL LOW (ref 12.0–15.0)
LYMPHS ABS: 3.1 10*3/uL (ref 0.7–4.0)
Lymphocytes Relative: 30 % (ref 12–46)
MCH: 26 pg (ref 26.0–34.0)
MCHC: 32.7 g/dL (ref 30.0–36.0)
MCV: 79.7 fL (ref 78.0–100.0)
Monocytes Absolute: 0.6 10*3/uL (ref 0.1–1.0)
Monocytes Relative: 6 % (ref 3–12)
NEUTROS ABS: 6.1 10*3/uL (ref 1.7–7.7)
NEUTROS PCT: 59 % (ref 43–77)
Platelets: 310 10*3/uL (ref 150–400)
RBC: 4.38 MIL/uL (ref 3.87–5.11)
RDW: 15.7 % — ABNORMAL HIGH (ref 11.5–15.5)
WBC: 10.2 10*3/uL (ref 4.0–10.5)

## 2015-02-28 LAB — WET PREP, GENITAL
CLUE CELLS WET PREP: NONE SEEN
TRICH WET PREP: NONE SEEN
Yeast Wet Prep HPF POC: NONE SEEN

## 2015-02-28 LAB — URINALYSIS, ROUTINE W REFLEX MICROSCOPIC
Bilirubin Urine: NEGATIVE
Glucose, UA: NEGATIVE mg/dL
Ketones, ur: NEGATIVE mg/dL
NITRITE: NEGATIVE
PH: 6 (ref 5.0–8.0)
Protein, ur: NEGATIVE mg/dL
Specific Gravity, Urine: 1.01 (ref 1.005–1.030)
Urobilinogen, UA: 0.2 mg/dL (ref 0.0–1.0)

## 2015-02-28 LAB — URINE MICROSCOPIC-ADD ON

## 2015-02-28 LAB — POCT PREGNANCY, URINE: PREG TEST UR: POSITIVE — AB

## 2015-02-28 LAB — HCG, QUANTITATIVE, PREGNANCY: hCG, Beta Chain, Quant, S: 16374 m[IU]/mL — ABNORMAL HIGH (ref <5)

## 2015-02-28 LAB — ABO/RH: ABO/RH(D): O POS

## 2015-02-28 NOTE — MAU Provider Note (Signed)
History     CSN: [id number]  Arrival date and time: 02/28/15 2128   First Provider Initiated Contact with Patient 02/28/15 2224      Chief Complaint  Patient presents with  . Vaginal Bleeding   HPI  [name] is a 29 y.o. [phone] at [redacted]w[redacted]d who presents to MAU today with complaint of vaginal bleeding since earlier tonight. She states she noted a small amount of red blood in the toilet and on the tissue. She denise blood on her pad between home and MAU. She states that she has had appropriate rise in quant hCG in the office recently and has [place] scheduled for Wednesday. She denies previous episodes of bleeding in the pregnancy. She also endorses mild lower abdominal cramping which has been present x weeks. She denies vaginal discharge, recent intercourse, N/V or fever. LMP was approximately 01/06/15.   OB History    Gravida Para Term Preterm AB TAB SAB Ectopic Multiple Living   Past Medical History  Diagnosis Date  . Asthma     Past Surgical History  Procedure Laterality Date  . Bartholin gland cyst excision      Family History  Problem Relation Age of Onset  . Hearing loss Mother   . Asthma Brother   . Diabetes Maternal Uncle     History  Substance Use Topics  . Smoking status: Never Smoker   . Smokeless tobacco: Not on file  . Alcohol Use: No    Allergies:  Allergies  Allergen Reactions  . Latex Rash    Rash if wearing gloves    Prescriptions prior to admission  Medication Sig Dispense Refill Last Dose  . Prenatal Vit-Fe Fumarate-FA (PRENATAL MULTIVITAMIN) TABS tablet Take 1 tablet by mouth at bedtime.   02/28/2015 at Unknown time  . ibuprofen (ADVIL,MOTRIN) 600 MG tablet Take 1 tablet (600 mg total) by mouth every 6 (six) hours as needed. (Patient not taking: Reported on 02/28/2015) 90 tablet 0 more than one month  . oxyCODONE-acetaminophen (ROXICET) 5-325 MG per tablet Take 2 tablets by mouth every 4 (four) hours as needed. May take  1-2 tablets every 4-6 hours as needed for pain (Patient not taking: Reported on 02/28/2015) 30 tablet 0 more than one month  . zolpidem (AMBIEN) 5 MG tablet Take 1 tablet (5 mg total) by mouth at bedtime as needed for sleep. (Patient not taking: Reported on 02/28/2015) 30 tablet 0 more than one month    Review of Systems  Constitutional: Negative for fever and malaise/fatigue.  Gastrointestinal: Positive for abdominal pain (mild cramping). Negative for nausea and vomiting.  Genitourinary: Negative for dysuria, urgency and frequency.       Neg - vaginal discharge + vaginal bleeding   Physical Exam   Blood pressure 164/82, pulse 93, temperature 98 F (36.7 C), temperature source Oral, resp. rate 18, height  (1.727 m), weight 256 lb (116.121 kg), last menstrual period 01/06/2015, SpO2 100 %.  Physical Exam  Constitutional: She is oriented to person, place, and time. She appears well-developed and well-nourished. No distress.  HENT:  Head: Normocephalic.  Cardiovascular: Normal rate.   Respiratory: Effort normal.  GI: Soft. She exhibits no distension and no mass. There is no tenderness. There is no rebound and no guarding.  Genitourinary: Uterus is not enlarged and not tender. Cervix exhibits no motion tenderness, no discharge and no friability. Right adnexum displays no mass and  no tenderness. Left adnexum displays no mass and no tenderness. There is bleeding (scant pink, light brown noted in the vaginal vault) in the vagina. No vaginal discharge found.  Neurological: She is alert and oriented to person, place, and time.  Skin: Skin is warm and dry. No erythema.  Psychiatric: She has a normal mood and affect.   Results for orders placed or performed during the hospital encounter of 02/28/15 (from the past 24 hour(s))  Urinalysis, Routine w reflex microscopic     Status: Abnormal   Collection Time: 02/28/15  9:51 PM  Result Value Ref Range   Color, Urine YELLOW YELLOW   APPearance CLEAR  CLEAR   Specific Gravity, Urine 1.010 1.005 - 1.030   pH 6.0 5.0 - 8.0   Glucose, UA NEGATIVE NEGATIVE mg/dL   Hgb urine dipstick SMALL (A) NEGATIVE   Bilirubin Urine NEGATIVE NEGATIVE   Ketones, ur NEGATIVE NEGATIVE mg/dL   Protein, ur NEGATIVE NEGATIVE mg/dL   Urobilinogen, UA 0.2 0.0 - 1.0 mg/dL   Nitrite NEGATIVE NEGATIVE   Leukocytes, UA TRACE (A) NEGATIVE  Urine microscopic-add on     Status: None   Collection Time: 02/28/15  9:51 PM  Result Value Ref Range   Squamous Epithelial / LPF RARE RARE   WBC, UA 0-2 <3 WBC/hpf   Bacteria, UA RARE RARE  Pregnancy, urine POC     Status: Abnormal   Collection Time: 02/28/15  9:59 PM  Result Value Ref Range   Preg Test, Ur POSITIVE (A) NEGATIVE  Wet prep, genital     Status: Abnormal   Collection Time: 02/28/15 10:35 PM  Result Value Ref Range   Yeast Wet Prep HPF POC NONE SEEN NONE SEEN   Trich, Wet Prep NONE SEEN NONE SEEN   Clue Cells Wet Prep HPF POC NONE SEEN NONE SEEN   WBC, Wet Prep HPF POC MODERATE (A) NONE SEEN  CBC with Differential/Platelet     Status: Abnormal   Collection Time: 02/28/15 10:37 PM  Result Value Ref Range   WBC 10.2 4.0 - 10.5 K/uL   RBC 4.38 3.87 - 5.11 MIL/uL   Hemoglobin 11.4 (L) 12.0 - 15.0 g/dL   HCT 81.1 (L) 91.4 - 78.2 %   MCV 79.7 78.0 - 100.0 fL   MCH 26.0 26.0 - 34.0 pg   MCHC 32.7 30.0 - 36.0 g/dL   RDW 95.6 (H) 21.3 - 08.6 %   Platelets 310 150 - 400 K/uL   Neutrophils Relative % 59 43 - 77 %   Neutro Abs 6.1 1.7 - 7.7 K/uL   Lymphocytes Relative 30 12 - 46 %   Lymphs Abs 3.1 0.7 - 4.0 K/uL   Monocytes Relative 6 3 - 12 %   Monocytes Absolute 0.6 0.1 - 1.0 K/uL   Eosinophils Relative 4 0 - 5 %   Eosinophils Absolute 0.4 0.0 - 0.7 K/uL   Basophils Relative 1 0 - 1 %   Basophils Absolute 0.1 0.0 - 0.1 K/uL  hCG, quantitative, pregnancy     Status: Abnormal   Collection Time: 02/28/15 10:37 PM  Result Value Ref Range   hCG, Beta Chain, Quant, S 16374 (H) <5 mIU/mL  ABO/Rh     Status:  None   Collection Time: 02/28/15 10:37 PM  Result Value Ref Range   ABO/RH(D) O POS    US Ob Comp Less 14 Wks  03/01/2015   CLINICAL DATA:  Vaginal bleeding and spotting since earlier today. Estimated gestational age  by LMP is 7 weeks 4 days. Quantitative beta HCG is pending.  EXAM: OBSTETRIC <14 WK US AND TRANSVAGINAL OB US  TECHNIQUE: Both transabdominal and transvaginal ultrasound examinations were performed for complete evaluation of the gestation as well as the maternal uterus, adnexal regions, and pelvic cul-de-sac. Transvaginal technique was performed to assess early pregnancy.  COMPARISON:  None.  FINDINGS: Intrauterine gestational sac: A single intrauterine gestational sac is identified.  Yolk sac:  Present.  Embryo:  Present.  Cardiac Activity: Observed.  Heart Rate: 101  bpm  CRL:  2.4  mm   5 w   6 d                  US EDC: 10/25/2015  Maternal uterus/adnexae: Uterus appears retroverted. No myometrial mass lesions. Moderate-sized subchorionic hemorrhage is identified measuring 2.7 x 1.8 x 2.8 cm. Left ovary is visualized and appears normal. Corpus luteal cyst is suggested. Right ovary is not visualized due to bowel gas. No free pelvic fluid.  IMPRESSION: Single intrauterine pregnancy. Estimated gestational age by crown-rump length is 5 weeks 6 days. Moderate size subchorionic hemorrhage is noted.   Electronically Signed   By: Burman NievesWilliam  Stevens M.D.   On: 03/01/2015 00:09   [place] Ob Transvaginal  03/01/2015   CLINICAL DATA:  Vaginal bleeding and spotting since earlier today. Estimated gestational age by LMP is 7 weeks 4 days. Quantitative beta HCG is pending.  EXAM: OBSTETRIC <14 WK US AND TRANSVAGINAL OB US  TECHNIQUE: Both transabdominal and transvaginal ultrasound examinations were performed for complete evaluation of the gestation as well as the maternal uterus, adnexal regions, and pelvic cul-de-sac. Transvaginal technique was performed to assess early pregnancy.  COMPARISON:  None.  FINDINGS:  Intrauterine gestational sac: A single intrauterine gestational sac is identified.  Yolk sac:  Present.  Embryo:  Present.  Cardiac Activity: Observed.  Heart Rate: 101  bpm  CRL:  2.4  mm   5 w   6 d                  US EDC: 10/25/2015  Maternal uterus/adnexae: Uterus appears retroverted. No myometrial mass lesions. Moderate-sized subchorionic hemorrhage is identified measuring 2.7 x 1.8 x 2.8 cm. Left ovary is visualized and appears normal. Corpus luteal cyst is suggested. Right ovary is not visualized due to bowel gas. No free pelvic fluid.  IMPRESSION: Single intrauterine pregnancy. Estimated gestational age by crown-rump length is 5 weeks 6 days. Moderate size subchorionic hemorrhage is noted.   Electronically Signed   By: Burman NievesWilliam  Stevens M.D.   On: 03/01/2015 00:09    MAU Course  Procedures None  MDM + UPT UA, wet prep, GC/Chlamydia, CBC, quant hCG, HIV, RPR and US today Patient chart originally noted patient was O negative blood type. Ordered Rhogam work-up and lab states patient is O+. Confirmed with ABO/Rh today that she is O+. Coincidentally that is also what the patient thought.  Discussed patient, labs and US results with Dr. Mora ApplPinn. Ok for discharge with precautions. Call the office for an appointment to follow-up next week.  Assessment and Plan  A: SIUP at [id number] Moderate subchorionic hemorrhage  P: Discharge home Bleeding precautions discussed Patient advised to call the office to reschedule visit for Wednesday to next week Patient may return to MAU as needed or if her condition were to change or worsen   Marny LowensteinJulie N Pranathi Winfree, PA-C  03/01/2015, 12:15 AM

## 2015-02-28 NOTE — MAU Note (Addendum)
Pt was being followed in office for beta HCG and unsure of her EDC. Pt had some some bleeding this evening. Denies any recent intercourse. Pt is scheduled for an Ultrasound on Wednesday in the office.

## 2015-02-28 NOTE — MAU Note (Signed)
Pt reports she is 6-[redacted] weeks pregnant and she started having vaginal bleeding about one hour ago, states she is having mild cramping. Positive preg test at doctor's office.

## 2015-03-01 ENCOUNTER — Encounter (HOSPITAL_COMMUNITY): Payer: Self-pay | Admitting: Medical

## 2015-03-01 DIAGNOSIS — Z3A01 Less than 8 weeks gestation of pregnancy: Secondary | ICD-10-CM

## 2015-03-01 DIAGNOSIS — O468X1 Other antepartum hemorrhage, first trimester: Secondary | ICD-10-CM

## 2015-03-01 LAB — GC/CHLAMYDIA PROBE AMP (~~LOC~~) NOT AT ARMC
Chlamydia: NEGATIVE
NEISSERIA GONORRHEA: NEGATIVE

## 2015-03-01 NOTE — Discharge Instructions (Signed)
Subchorionic Hematoma °A subchorionic hematoma is a gathering of blood between the outer wall of the placenta and the inner wall of the womb (uterus). The placenta is the organ that connects the fetus to the wall of the uterus. The placenta performs the feeding, breathing (oxygen to the fetus), and waste removal (excretory work) of the fetus.  °Subchorionic hematoma is the most common abnormality found on a result from ultrasonography done during the first trimester or early second trimester of pregnancy. If there has been little or no vaginal bleeding, early small hematomas usually shrink on their own and do not affect your baby or pregnancy. The blood is gradually absorbed over 1-2 weeks. When bleeding starts later in pregnancy or the hematoma is larger or occurs in an older pregnant woman, the outcome may not be as good. Larger hematomas may get bigger, which increases the chances for miscarriage. Subchorionic hematoma also increases the risk of premature detachment of the placenta from the uterus, preterm (premature) labor, and stillbirth. °HOME CARE INSTRUCTIONS °· Stay on bed rest if your health care provider recommends this. Although bed rest will not prevent more bleeding or prevent a miscarriage, your health care provider may recommend bed rest until you are advised otherwise. °· Avoid heavy lifting (more than 10 lb [4.5 kg]), exercise, sexual intercourse, or douching as directed by your health care provider. °· Keep track of the number of pads you use each day and how soaked (saturated) they are. Write down this information. °· Do not use tampons. °· Keep all follow-up appointments as directed by your health care provider. Your health care provider may ask you to have follow-up blood tests or ultrasound tests or both. °SEEK IMMEDIATE MEDICAL CARE IF: °· You have severe cramps in your stomach, back, abdomen, or pelvis. °· You have a fever. °· You pass large clots or tissue. Save any tissue for your health  care provider to look at. °· Your bleeding increases or you become lightheaded, feel weak, or have fainting episodes. °Document Released: 03/27/2007 Document Revised: 04/26/2014 Document Reviewed: 07/09/2013 °ExitCare® Patient Information ©2015 [employer]. This information is not intended to replace advice given to you by your health care provider. Make sure you discuss any questions you have with your health care provider. ° °Pelvic Rest °Pelvic rest is sometimes recommended for women when:  °· The placenta is partially or completely covering the opening of the cervix (placenta previa). °· There is bleeding between the uterine wall and the amniotic sac in the first trimester (subchorionic hemorrhage). °· The cervix begins to open without labor starting (incompetent cervix, cervical insufficiency). °· The labor is too early (preterm labor). °HOME CARE INSTRUCTIONS °· Do not have sexual intercourse, stimulation, or an orgasm. °· Do not use tampons, douche, or put anything in the vagina. °· Do not lift anything over 10 pounds (4.5 kg). °· Avoid strenuous activity or straining your pelvic muscles. °SEEK MEDICAL CARE IF:  °· You have any vaginal bleeding during pregnancy. Treat this as a potential emergency. °· You have cramping pain felt low in the stomach (stronger than menstrual cramps). °· You notice vaginal discharge (watery, mucus, or bloody). °· You have a low, dull backache. °· There are regular contractions or uterine tightening. °SEEK IMMEDIATE MEDICAL CARE IF: °You have vaginal bleeding and have placenta previa.  °Document Released: 04/06/2011 Document Revised: 03/03/2012 Document Reviewed: 04/06/2011 °ExitCare® Patient Information ©2015 [employer]. This information is not intended to replace advice given to you by your health care provider.   Make sure you discuss any questions you have with your health care provider.  Threatened Miscarriage A threatened miscarriage is when you have vaginal bleeding  during your first 20 weeks of pregnancy but the pregnancy has not ended. Your doctor will do tests to make sure you are still pregnant. The cause of the bleeding may not be known. This condition does not mean your pregnancy will end. It does increase the risk of it ending (complete miscarriage). HOME CARE   Make sure you keep all your doctor visits for prenatal care.  Get plenty of rest.  Do not have sex or use tampons if you have vaginal bleeding.  Do not douche.  Do not smoke or use drugs.  Do not drink alcohol.  Avoid caffeine. GET HELP IF:  You have light bleeding from your vagina.  You have belly pain or cramping.  You have a fever. GET HELP RIGHT AWAY IF:   You have heavy bleeding from your vagina.  You have clots of blood coming from your vagina.  You have bad pain or cramps in your low back or belly.  You have fever, chills, and bad belly pain. MAKE SURE YOU:   Understand these instructions.  Will watch your condition.  Will get help right away if you are not doing well or get worse. Document Released: 11/22/2008 Document Revised: 12/15/2013 Document Reviewed: 10/06/2013 [facility] Patient Information 2015 [employer], [place]. This information is not intended to replace advice given to you by your health care provider. Make sure you discuss any questions you have with your health care provider.

## 2015-03-02 LAB — RPR: RPR Ser Ql: NONREACTIVE

## 2015-03-02 LAB — HIV ANTIBODY (ROUTINE TESTING W REFLEX): HIV SCREEN 4TH GENERATION: NONREACTIVE

## 2015-03-09 ENCOUNTER — Other Ambulatory Visit: Payer: Self-pay | Admitting: Obstetrics and Gynecology

## 2015-03-09 LAB — OB RESULTS CONSOLE GC/CHLAMYDIA: GC PROBE AMP, GENITAL: NEGATIVE

## 2015-03-10 LAB — CYTOLOGY - PAP

## 2015-04-18 LAB — OB RESULTS CONSOLE HIV ANTIBODY (ROUTINE TESTING): HIV: NONREACTIVE

## 2015-04-21 ENCOUNTER — Encounter (HOSPITAL_COMMUNITY): Payer: Self-pay | Admitting: Obstetrics and Gynecology

## 2015-04-29 ENCOUNTER — Encounter (HOSPITAL_COMMUNITY): Payer: Self-pay | Admitting: Obstetrics & Gynecology

## 2015-04-29 ENCOUNTER — Other Ambulatory Visit (HOSPITAL_COMMUNITY): Payer: Self-pay | Admitting: Obstetrics and Gynecology

## 2015-04-29 ENCOUNTER — Encounter (HOSPITAL_COMMUNITY): Payer: Self-pay

## 2015-04-29 ENCOUNTER — Other Ambulatory Visit (HOSPITAL_COMMUNITY): Payer: Self-pay | Admitting: Obstetrics

## 2015-04-29 ENCOUNTER — Ambulatory Visit (HOSPITAL_COMMUNITY)
Admission: RE | Admit: 2015-04-29 | Discharge: 2015-04-29 | Disposition: A | Discharge status: Home / Self Care | Payer: Medicaid Other | Source: Ambulatory Visit | Attending: Obstetrics and Gynecology | Admitting: Obstetrics and Gynecology

## 2015-04-29 VITALS — BP 122/77 | HR 87 | Wt 260.0 lb

## 2015-04-29 DIAGNOSIS — O99212 Obesity complicating pregnancy, second trimester: Secondary | ICD-10-CM | POA: Diagnosis not present

## 2015-04-29 DIAGNOSIS — Z315 Encounter for genetic counseling: Secondary | ICD-10-CM | POA: Diagnosis present

## 2015-04-29 DIAGNOSIS — O09292 Supervision of pregnancy with other poor reproductive or obstetric history, second trimester: Secondary | ICD-10-CM | POA: Diagnosis not present

## 2015-04-29 DIAGNOSIS — Z3A14 14 weeks gestation of pregnancy: Secondary | ICD-10-CM | POA: Diagnosis not present

## 2015-04-29 DIAGNOSIS — IMO0002 Reserved for concepts with insufficient information to code with codable children: Secondary | ICD-10-CM

## 2015-04-29 DIAGNOSIS — O280 Abnormal hematological finding on antenatal screening of mother: Secondary | ICD-10-CM

## 2015-04-29 DIAGNOSIS — O351XX0 Maternal care for (suspected) chromosomal abnormality in fetus, not applicable or unspecified: Secondary | ICD-10-CM | POA: Diagnosis not present

## 2015-04-29 DIAGNOSIS — Z0489 Encounter for examination and observation for other specified reasons: Secondary | ICD-10-CM

## 2015-04-29 DIAGNOSIS — O09299 Supervision of pregnancy with other poor reproductive or obstetric history, unspecified trimester: Secondary | ICD-10-CM

## 2015-04-29 DIAGNOSIS — O09899 Supervision of other high risk pregnancies, unspecified trimester: Secondary | ICD-10-CM | POA: Insufficient documentation

## 2015-04-29 DIAGNOSIS — Z8759 Personal history of other complications of pregnancy, childbirth and the puerperium: Secondary | ICD-10-CM

## 2015-04-29 NOTE — Progress Notes (Signed)
Genetic Counseling  High-Risk Gestation Note  Appointment Date:  04/29/2015 Referred By: Anderson, Mark E, MD Date of Birth:  01/12/1986   Pregnancy History: G6P2031 Estimated Date of Delivery: 10/25/15 Estimated Gestational Age: [redacted]w[redacted]d Attending: Martha Decker, MD   [name] was seen for genetic counseling because of an increased risk for fetal Down syndrome based on First trimester screening through [facility].  She was counseled regarding the First trimester screen result and the associated 1 in 159 risk for fetal Down syndrome.  We reviewed chromosomes, nondisjunction, and the common features and variable prognosis of Down syndrome.  In addition, we reviewed the screen negative risk for trisomy 18.  We also discussed other explanations for a screen positive result including differences in maternal metabolism, and normal variation. She understands that this screening is not diagnostic for Down syndrome but provides a risk assessment.  We specifically discussed that the level of one of the proteins analyzed on the first trimester screen, PAPP-A, was very low (0.25 MoM).  This has been associated with an increased risk for growth restriction or poor pregnancy outcome later in pregnancy; therefore, we would recommend a follow up ultrasound for fetal growth in the third trimester.  [name] stated that she previously had Panorama performed through her OB office, which resulted as "low risk." We do not have documentation of this lab report at this time. We reviewed noninvasive prenatal screening (NIPS)/cell free DNA testing. We specifically discussed the methodology of this screening technique. We reviewed that this screen is highly specific and sensitive regarding fetal Down syndrome but is not considered diagnostic. We also reviewed targeted ultrasound as a screening tool for fetal aneuploidy including the benefits and limitations. She was also counseled regarding diagnostic  testing via amniocentesis. We reviewed the approximate 1 in 300-500 risk for complications for amniocentesis, including spontaneous pregnancy loss.   After consideration of all the options, she declined amniocentesis for fetal aneuploidy, stating that she was comfortable with the assessment provided by Panorama.  The patient also expressed interest in having a detailed ultrasound, which was scheduled for 06/03/15. She understands that screening tests cannot rule out all birth defects or genetic syndromes. The patient was advised of this limitation and states she still does not want additional testing at this time.   Both family histories were reviewed and found to be noncontributory for birth defects, intellectual disability, known genetic conditions or recurrent pregnancy loss. The couple's most recent pregnancy resulted in stillbirth at [redacted] weeks gestation. [name] stated that she is coping appropriately and sees a therapist regularly. The underlying cause for stillbirth was not determined. [name] also met with Dr. Street today for MFM consultation regarding this history. Without further information regarding the provided family history, an accurate genetic risk cannot be calculated. Further genetic counseling is warranted if more information is obtained.  [name] denied exposure to environmental toxins or chemical agents. She denied the use of alcohol, tobacco or street drugs. She denied significant viral illnesses during the course of her pregnancy. Her medical and surgical histories were contributory for previous stillbirth at [redacted] weeks gestation in 2015. Please see separate MFM consult note from today's visit regarding detailed discussion of this history and pregnancy management.    I counseled [name] for approximately 35 minutes regarding the above risks and available options.   Karen Corneliussen, MS,  Certified Genetic Counselor 04/29/2015  

## 2015-04-29 NOTE — Progress Notes (Signed)
MATERNAL FETAL MEDICINE CONSULT  Patient Name: Kristy Tucker Medical Record Number:  [id number] Date of Birth: Nov 04, 1986 Requesting Physician Name:  Levi AlandMark E Anderson, MD Date of Service: 04/29/2015  Chief Complaint History of IUFD at 41 weeks  History of Present Illness Kristy Tucker was seen today for prenatal diagnosis secondary to history of 41 week IUFD at the request of Dr. Dareen PianoAnderson.  The patient is a 29 y.o. [id number],at 6659w3d with an EDD of 10/25/2015, by Ultrasound dating method.  Of note in this pregnancy she had an elevated risk of trisomy 21 on first trimester screen (1:159) and a low PAPPA, low risk panorama.  Please see genetic counseling note for full details.  [name] had a 41 week IUFD in 10/15.  Karyotype was normal.  Antiphospholipid antibody workup was negative.  Other than obesity, I can not identify any additional risk factors.    Review of Systems Pertinent items are noted in HPI.  Patient History OB History  Gravida Para Term Preterm AB SAB TAB Ectopic Multiple Living  6 2 2  3 1 2   1     # Outcome Date GA Lbr Len/2nd Weight Sex Delivery Anes PTL Lv  6 Current           5 Term 10/23/14 7092w0d 09:22 / 00:09 6 lb 9.1 oz (2.98 kg) M Vag-Spont EPI  FD  4 Term 10/30/12 4961w5d 13:50 / 02:08 6 lb 15.8 oz (3.17 kg) M Vag-Forceps EPI  Y  3 TAB           2 TAB           1 SAB               Past Medical History  Diagnosis Date  . Asthma     Past Surgical History  Procedure Laterality Date  . Bartholin gland cyst excision      History   Social History  . Marital Status: Single    Spouse Name: N/A  . Number of Children: N/A  . Years of Education: N/A   Social History Main Topics  . Smoking status: Never Smoker   . Smokeless tobacco: Not on file  . Alcohol Use: No  . Drug Use: No  . Sexual Activity: Yes    Birth Control/ Protection: None   Other Topics Concern  . None   Social History Narrative    Family History  Problem Relation Age of Onset   . Hearing loss Mother   . Asthma Brother   . Diabetes Maternal Uncle    In addition, the patient has no family history of mental retardation, birth defects, or genetic diseases.  Physical Examination Filed Vitals:   04/29/15 0906  BP: 122/77  Pulse: 87   General appearance - alert, well appearing, and in no distress  Assessment and Recommendations 1. Abnl first trimester screen results- see genetic counseling note.  Low risk panorama (NIPS). 2. History of 41 week IUFD- Workup was negative.  Counseled that unfortunately etiology remains unknown in about 50% of cases and in this situation recurrence rate is low.  Nonetheless would recommend antenatal testing to begin at 32-34 weeks. Recommend delivery at 39 weeks.   3. Obesity-  Obesity in pregnancy is associated with an increased risk of both early fetal loss and stillbirth. In a [employer] database, the risk of stillbirth was 1/200 for non-obese mothers and increases to 1/100 for morbid obesity (BMI > 40). The reason for this increased still-birth risk with advancing  gestation is likely multifactorial, but associated with placental dysfunction. The obese gravida is also at increased risk for preeclampsia, gestational or overt diabetes, other endocrinopathies (hypothyroidism), anesthetic complications related to sleep apnea, psychiatric disease including depression, risk for dysfunctional labor leading to a higher Cesarean delivery rate, and increased risk for thromboembolic disease.  [name] was counseled on the IOM weight gain guidelines of 11-20 pounds.    50 minutes was spent with [name] with >50% being devoted to face to face counseling on the above.    Molly MaduroSTREET, Devondre Guzzetta, MD

## 2015-06-03 ENCOUNTER — Ambulatory Visit (HOSPITAL_COMMUNITY)
Admission: RE | Admit: 2015-06-03 | Discharge: 2015-06-03 | Disposition: A | Discharge status: Home / Self Care | Payer: Medicaid Other | Source: Ambulatory Visit | Attending: Obstetrics and Gynecology | Admitting: Obstetrics and Gynecology

## 2015-06-03 ENCOUNTER — Encounter (HOSPITAL_COMMUNITY): Payer: Self-pay

## 2015-06-03 DIAGNOSIS — IMO0002 Reserved for concepts with insufficient information to code with codable children: Secondary | ICD-10-CM

## 2015-06-03 DIAGNOSIS — Z36 Encounter for antenatal screening of mother: Secondary | ICD-10-CM | POA: Diagnosis not present

## 2015-06-03 DIAGNOSIS — Z3A19 19 weeks gestation of pregnancy: Secondary | ICD-10-CM | POA: Diagnosis not present

## 2015-06-03 DIAGNOSIS — O99212 Obesity complicating pregnancy, second trimester: Secondary | ICD-10-CM | POA: Insufficient documentation

## 2015-06-03 DIAGNOSIS — O283 Abnormal ultrasonic finding on antenatal screening of mother: Secondary | ICD-10-CM | POA: Insufficient documentation

## 2015-06-03 DIAGNOSIS — O09292 Supervision of pregnancy with other poor reproductive or obstetric history, second trimester: Secondary | ICD-10-CM | POA: Diagnosis not present

## 2015-06-03 DIAGNOSIS — O281 Abnormal biochemical finding on antenatal screening of mother: Secondary | ICD-10-CM | POA: Insufficient documentation

## 2015-06-03 DIAGNOSIS — Z0489 Encounter for examination and observation for other specified reasons: Secondary | ICD-10-CM | POA: Insufficient documentation

## 2015-06-03 DIAGNOSIS — O09299 Supervision of pregnancy with other poor reproductive or obstetric history, unspecified trimester: Secondary | ICD-10-CM | POA: Insufficient documentation

## 2015-06-08 ENCOUNTER — Inpatient Hospital Stay (HOSPITAL_COMMUNITY)
Admission: AD | Admit: 2015-06-08 | Discharge: 2015-06-09 | Disposition: A | Discharge status: Home / Self Care | Payer: Medicaid Other | Source: Ambulatory Visit | Attending: Obstetrics and Gynecology | Admitting: Obstetrics and Gynecology

## 2015-06-08 ENCOUNTER — Encounter (HOSPITAL_COMMUNITY): Payer: Self-pay

## 2015-06-08 DIAGNOSIS — O99612 Diseases of the digestive system complicating pregnancy, second trimester: Secondary | ICD-10-CM | POA: Insufficient documentation

## 2015-06-08 DIAGNOSIS — Z3A20 20 weeks gestation of pregnancy: Secondary | ICD-10-CM | POA: Insufficient documentation

## 2015-06-08 DIAGNOSIS — K219 Gastro-esophageal reflux disease without esophagitis: Secondary | ICD-10-CM | POA: Insufficient documentation

## 2015-06-08 DIAGNOSIS — R1010 Upper abdominal pain, unspecified: Secondary | ICD-10-CM | POA: Diagnosis present

## 2015-06-08 NOTE — MAU Note (Signed)
Upper abdominal pain tonight that is worse when standing; sharp.  Denies vaginal bleeding or LOF.

## 2015-06-09 DIAGNOSIS — K219 Gastro-esophageal reflux disease without esophagitis: Secondary | ICD-10-CM | POA: Diagnosis not present

## 2015-06-09 LAB — URINALYSIS, ROUTINE W REFLEX MICROSCOPIC
BILIRUBIN URINE: NEGATIVE
GLUCOSE, UA: NEGATIVE mg/dL
HGB URINE DIPSTICK: NEGATIVE
KETONES UR: NEGATIVE mg/dL
Nitrite: NEGATIVE
PROTEIN: NEGATIVE mg/dL
Urobilinogen, UA: 0.2 mg/dL (ref 0.0–1.0)
pH: 6.5 (ref 5.0–8.0)

## 2015-06-09 LAB — URINE MICROSCOPIC-ADD ON

## 2015-06-09 MED ORDER — GI COCKTAIL ~~LOC~~
30.0000 mL | Freq: Once | ORAL | Status: AC
  Administered 2015-06-09: 30 mL via ORAL
  Filled 2015-06-09: qty 30

## 2015-06-09 MED ORDER — FAMOTIDINE 20 MG PO TABS: 20.0000 mg | ORAL_TABLET | Freq: Two times a day (BID) | ORAL | Status: DC

## 2015-06-09 NOTE — Discharge Instructions (Signed)

## 2015-06-09 NOTE — MAU Provider Note (Signed)
History     CSN: [id number]  Arrival date and time: 06/08/15 2338   First Provider Initiated Contact with Patient 06/09/15 0021      Chief Complaint  Patient presents with  . Abdominal Pain   HPI Comments: Kristy Tucker is 29 y.o. [id number] at [redacted]w[redacted]d who presents today with upper abdominal pain x a few hours. She has not taken anything for the pain. She has not called her doctors office. She last ate a burger around 2200, and the pain started soon afterward. She denies any vaginal bleeding or LOF. She started to feel some fetal movement. She denies any lower abdominal pain.   Abdominal Pain This is a new problem. The current episode started today (a few hours ago ). The onset quality is gradual. The problem occurs constantly. The problem has been unchanged. The pain is located in the epigastric region. The pain is at a severity of 6/10. The quality of the pain is sharp. The abdominal pain does not radiate. Pertinent negatives include no constipation, diarrhea, dysuria, fever, frequency, nausea or vomiting. Exacerbated by: standing up  The pain is relieved by being still (lying down ). She has tried nothing for the symptoms.    Past Medical History  Diagnosis Date  . Asthma     Past Surgical History  Procedure Laterality Date  . Bartholin gland cyst excision      Family History  Problem Relation Age of Onset  . Hearing loss Mother   . Asthma Brother   . Diabetes Maternal Uncle     History  Substance Use Topics  . Smoking status: Never Smoker   . Smokeless tobacco: Not on file  . Alcohol Use: No    Allergies:  Allergies  Allergen Reactions  . Latex Rash    Rash if wearing gloves    Prescriptions prior to admission  Medication Sig Dispense Refill Last Dose  . Prenatal Vit-Fe Fumarate-FA (PRENATAL MULTIVITAMIN) TABS tablet Take 1 tablet by mouth at bedtime.   06/07/2015 at Unknown time    Review of Systems  Constitutional: Negative for fever.  Gastrointestinal:  Positive for abdominal pain. Negative for nausea, vomiting, diarrhea and constipation.  Genitourinary: Negative for dysuria, urgency and frequency.   Physical Exam   Blood pressure 138/62, pulse 87, temperature 98.2 F (36.8 C), temperature source Oral, resp. rate 18, height 5\' 8"  (1.727 m), weight 121.745 kg (268 lb 6.4 oz), last menstrual period 01/06/2015, SpO2 100 %.  Physical Exam  Nursing note and vitals reviewed. Constitutional: She is oriented to person, place, and time. She appears well-developed and well-nourished.  HENT:  Head: Normocephalic.  Cardiovascular: Normal rate.   Respiratory: Effort normal.  GI: Soft. There is no tenderness. There is no rebound.  Neurological: She is alert and oriented to person, place, and time.  Skin: Skin is warm and dry.  Psychiatric: She has a normal mood and affect.   Results for orders placed or performed during the hospital encounter of 06/08/15 (from the past 24 hour(s))  Urinalysis, Routine w reflex microscopic (not at [facility])     Status: Abnormal   Collection Time: 06/08/15 11:45 PM  Result Value Ref Range   Color, Urine YELLOW YELLOW   APPearance CLEAR CLEAR   Specific Gravity, Urine <1.005 (L) 1.005 - 1.030   pH 6.5 5.0 - 8.0   Glucose, UA NEGATIVE NEGATIVE mg/dL   Hgb urine dipstick NEGATIVE NEGATIVE   Bilirubin Urine NEGATIVE NEGATIVE   Ketones, ur NEGATIVE NEGATIVE mg/dL  Protein, ur NEGATIVE NEGATIVE mg/dL   Urobilinogen, UA 0.2 0.0 - 1.0 mg/dL   Nitrite NEGATIVE NEGATIVE   Leukocytes, UA SMALL (A) NEGATIVE  Urine microscopic-add on     Status: None   Collection Time: 06/08/15 11:45 PM  Result Value Ref Range   Squamous Epithelial / LPF RARE RARE   WBC, UA 3-6 <3 WBC/hpf   Bacteria, UA RARE RARE    MAU Course  Procedures  MDM 0100: Patient reports pain has improved with GI cocktail  0104: D/W Dr. Tenny Craw, ok for DC home   Assessment and Plan   1. Gastroesophageal reflux disease without esophagitis    DC  home Comfort measures reviewed  2nd Trimester precautions  PTL precautions  Fetal kick counts RX: pepcid #30, 0RF  Return to MAU as needed FU with OB as planned  Follow-up Information    Follow up with Almon Hercules., MD.   Specialty:  Obstetrics and Gynecology   Why:  As scheduled   Contact information:   [address] [place] [address] [place] [address] [phone]         [name] 06/09/2015, 12:35 AM

## 2015-07-02 ENCOUNTER — Inpatient Hospital Stay (HOSPITAL_COMMUNITY)
Admission: AD | Admit: 2015-07-02 | Discharge: 2015-07-02 | Disposition: A | Discharge status: Home / Self Care | Payer: Medicaid Other | Source: Ambulatory Visit | Attending: Obstetrics and Gynecology | Admitting: Obstetrics and Gynecology

## 2015-07-02 ENCOUNTER — Encounter (HOSPITAL_COMMUNITY): Payer: Self-pay | Admitting: *Deleted

## 2015-07-02 DIAGNOSIS — K529 Noninfective gastroenteritis and colitis, unspecified: Secondary | ICD-10-CM

## 2015-07-02 DIAGNOSIS — Z3A23 23 weeks gestation of pregnancy: Secondary | ICD-10-CM | POA: Diagnosis not present

## 2015-07-02 DIAGNOSIS — O99612 Diseases of the digestive system complicating pregnancy, second trimester: Secondary | ICD-10-CM | POA: Insufficient documentation

## 2015-07-02 DIAGNOSIS — O212 Late vomiting of pregnancy: Secondary | ICD-10-CM | POA: Diagnosis present

## 2015-07-02 LAB — URINALYSIS, ROUTINE W REFLEX MICROSCOPIC
Bilirubin Urine: NEGATIVE
Glucose, UA: NEGATIVE mg/dL
Hgb urine dipstick: NEGATIVE
KETONES UR: 40 mg/dL — AB
NITRITE: NEGATIVE
Protein, ur: NEGATIVE mg/dL
Urobilinogen, UA: 0.2 mg/dL (ref 0.0–1.0)
pH: 6 (ref 5.0–8.0)

## 2015-07-02 LAB — URINE MICROSCOPIC-ADD ON

## 2015-07-02 MED ORDER — ONDANSETRON HCL 4 MG/2ML IJ SOLN
4.0000 mg | Freq: Once | INTRAMUSCULAR | Status: AC
  Administered 2015-07-02: 4 mg via INTRAVENOUS
  Filled 2015-07-02: qty 2

## 2015-07-02 MED ORDER — ONDANSETRON HCL 4 MG PO TABS: 4.0000 mg | ORAL_TABLET | Freq: Four times a day (QID) | ORAL | Status: DC

## 2015-07-02 MED ORDER — LACTATED RINGERS IV BOLUS (SEPSIS)
1000.0000 mL | Freq: Once | INTRAVENOUS | Status: AC
  Administered 2015-07-02: 1000 mL via INTRAVENOUS

## 2015-07-02 NOTE — Discharge Instructions (Signed)

## 2015-07-02 NOTE — MAU Note (Addendum)
N/v/d since 2230. Toddler admitted Weds night for diarrhea but sent home. Pt had 41wk IUFD 10/23/14.

## 2015-07-02 NOTE — MAU Provider Note (Signed)
History     CSN: [id number]  Arrival date and time: 07/02/15 0126   First Provider Initiated Contact with Patient 07/02/15 0207      Chief Complaint  Patient presents with  . Emesis During Pregnancy  . Diarrhea  . Chills  . Abdominal Cramping   HPI  [name] is a 29 y.o. [id number] at 2963w4d who presents to MAU today with complaint of N/V/D since 2230 last night. The patient states that her child had similar symptoms earlier this week. She denies fever, abdominal pain or vaginal bleeding. She endorses chills earlier tonight and occasional contractions. She reports good fetal movement.   OB History    Gravida Para Term Preterm AB TAB SAB Ectopic Multiple Living   6 2 2  3 2 1   1       Past Medical History  Diagnosis Date  . Asthma     Past Surgical History  Procedure Laterality Date  . Bartholin gland cyst excision      Family History  Problem Relation Age of Onset  . Hearing loss Mother   . Asthma Brother   . Diabetes Maternal Uncle     History  Substance Use Topics  . Smoking status: Never Smoker   . Smokeless tobacco: Not on file  . Alcohol Use: No    Allergies:  Allergies  Allergen Reactions  . Latex Rash    Rash if wearing gloves    Prescriptions prior to admission  Medication Sig Dispense Refill Last Dose  . Prenatal Vit-Fe Fumarate-FA (PRENATAL MULTIVITAMIN) TABS tablet Take 1 tablet by mouth at bedtime.   07/01/2015 at Unknown time  . famotidine (PEPCID) 20 MG tablet Take 1 tablet (20 mg total) by mouth 2 (two) times daily. 30 tablet 0     Review of Systems  Constitutional: Positive for chills. Negative for fever and malaise/fatigue.  Gastrointestinal: Positive for nausea, vomiting and diarrhea. Negative for abdominal pain and constipation.  Genitourinary: Negative for dysuria, urgency and frequency.   Physical Exam   Blood pressure 136/70, pulse 89, temperature 97.6 F (36.4 C), temperature source Oral, resp. rate 16, height 5\' 8"   (1.727 m), weight 267 lb 3.2 oz (121.201 kg), last menstrual period 01/06/2015.  Physical Exam  Nursing note and vitals reviewed. Constitutional: She is oriented to person, place, and time. She appears well-developed and well-nourished. No distress.  HENT:  Head: Normocephalic and atraumatic.  Cardiovascular: Normal rate.   Respiratory: Effort normal.  GI: Soft. She exhibits no distension and no mass. There is no tenderness. There is no rebound and no guarding.  Neurological: She is alert and oriented to person, place, and time.  Skin: Skin is warm and dry. No erythema.  Psychiatric: She has a normal mood and affect.   Results for orders placed or performed during the hospital encounter of 07/02/15 (from the past 24 hour(s))  Urinalysis, [facility] w reflex microscopic [facility])     Status: Abnormal   Collection Time: 07/02/15  4:57 AM  Result Value Ref Range   Color, Urine YELLOW YELLOW   APPearance CLEAR CLEAR   Specific Gravity, Urine >1.030 (H) 1.005 - 1.030   pH 6.0 5.0 - 8.0   Glucose, UA NEGATIVE NEGATIVE mg/dL   Hgb urine dipstick NEGATIVE NEGATIVE   Bilirubin Urine NEGATIVE NEGATIVE   Ketones, ur 40 (A) NEGATIVE mg/dL   Protein, ur NEGATIVE NEGATIVE mg/dL   Urobilinogen, UA 0.2 0.0 - 1.0 mg/dL   Nitrite NEGATIVE NEGATIVE  Leukocytes, UA SMALL (A) NEGATIVE  Urine microscopic-add on     Status: Abnormal   Collection Time: 07/02/15  4:57 AM  Result Value Ref Range   Squamous Epithelial / LPF RARE RARE   WBC, UA 3-6 <3 WBC/hpf   RBC / HPF 0-2 <3 RBC/hpf   Bacteria, UA FEW (A) RARE    Fetal Monitoring: Baseline: 145 bpm, moderate variability, + accelerations, no decelerations Contractions: none Are of tracing of maternal HR with patient having active vomiting  MAU Course  Procedures  MDM Unable to give urine sample Discussed patient with Dr. Tenny Craw. Agrees with plan for IV hydration and antiemetics. Ok to discharge with Rx for Zofran.  IV fluids with Zofran  ordered Patient has not had emesis or diarrhea in > 1 hour. Still having some nausea 4 mg IV Zofran given prior to discharge Patient reports some improvement in nausea, still no vomiting or diarrhea recently. Patient able to give urine sample.  Assessment and Plan  A: SIUP at [redacted]w[redacted]d Gastroenteritis  P: Discharge home Rx for Zofran given to patient Diet for N/V discussed Patient advised to increase PO hydration and advance diet as tolerated Patient advised to follow-up with [facility] as scheduled for routine prenatal care or sooner PRN Patient may return to MAU as needed or if her condition were to change or worsen   Marny Lowenstein, PA-C  07/02/2015, 4:19 AM

## 2015-08-30 ENCOUNTER — Encounter (HOSPITAL_COMMUNITY): Payer: Self-pay | Admitting: *Deleted

## 2015-08-30 ENCOUNTER — Inpatient Hospital Stay (HOSPITAL_COMMUNITY)
Admission: AD | Admit: 2015-08-30 | Discharge: 2015-08-30 | Disposition: A | Discharge status: Home / Self Care | Payer: Medicaid Other | Source: Ambulatory Visit | Attending: Obstetrics & Gynecology | Admitting: Obstetrics & Gynecology

## 2015-08-30 DIAGNOSIS — O26893 Other specified pregnancy related conditions, third trimester: Secondary | ICD-10-CM

## 2015-08-30 DIAGNOSIS — Z3A32 32 weeks gestation of pregnancy: Secondary | ICD-10-CM | POA: Diagnosis not present

## 2015-08-30 DIAGNOSIS — Z8759 Personal history of other complications of pregnancy, childbirth and the puerperium: Secondary | ICD-10-CM

## 2015-08-30 DIAGNOSIS — N898 Other specified noninflammatory disorders of vagina: Secondary | ICD-10-CM | POA: Diagnosis not present

## 2015-08-30 DIAGNOSIS — O99212 Obesity complicating pregnancy, second trimester: Secondary | ICD-10-CM

## 2015-08-30 DIAGNOSIS — O280 Abnormal hematological finding on antenatal screening of mother: Secondary | ICD-10-CM

## 2015-08-30 DIAGNOSIS — O09899 Supervision of other high risk pregnancies, unspecified trimester: Secondary | ICD-10-CM

## 2015-08-30 LAB — WET PREP, GENITAL
CLUE CELLS WET PREP: NONE SEEN
TRICH WET PREP: NONE SEEN
YEAST WET PREP: NONE SEEN

## 2015-08-30 LAB — AMNISURE RUPTURE OF MEMBRANE (ROM) NOT AT ARMC: Amnisure ROM: NEGATIVE

## 2015-08-30 NOTE — MAU Note (Signed)
Pt sent from MD office, states NST was not normal, pt states she has occasional braxton hicks, also says she has had ? LOF for the past several weeks, denies bleeding.

## 2015-08-30 NOTE — MAU Provider Note (Signed)
History   [id number]   Chief Complaint  Patient presents with  . Vaginal Discharge    HPI Kristy Tucker is a 29 y.o. female  [phone] here with report of watery vaginal discharge that began at approximately one week ago.  Leaking has been intermittent.  Pt reports occasional contractions and denies vaginal bleeding.  Last intercourse over 24 hours ago.   +fetal movement.   All other systems negative.    Patient's last menstrual period was 01/06/2015 (approximate).  OB History  Gravida Para Term Preterm AB SAB TAB Ectopic Multiple Living  6 2 2  3 1 2   1     # Outcome Date GA Lbr Len/2nd Weight Sex Delivery Anes PTL Lv  6 Current           5 Term 10/23/14 [redacted]w[redacted]d 09:22 / 00:09 2.98 kg (6 lb 9.1 oz) M Vag-Spont EPI  FD  4 Term 10/30/12 [redacted]w[redacted]d 13:50 / 02:08 3.17 kg (6 lb 15.8 oz) M Vag-Forceps EPI  Y  3 TAB           2 TAB           1 SAB               Past Medical History  Diagnosis Date  . Asthma     Family History  Problem Relation Age of Onset  . Hearing loss Mother   . Asthma Brother   . Diabetes Maternal Uncle     Social History   Social History  . Marital Status: Single    Spouse Name: N/A  . Number of Children: N/A  . Years of Education: N/A   Social History Main Topics  . Smoking status: Never Smoker   . Smokeless tobacco: None  . Alcohol Use: No  . Drug Use: No  . Sexual Activity: Yes    Birth Control/ Protection: None   Other Topics Concern  . None   Social History Narrative    Allergies  Allergen Reactions  . Latex Rash    Rash if wearing gloves    No current facility-administered medications on file prior to encounter.   Current Outpatient Prescriptions on File Prior to Encounter  Medication Sig Dispense Refill  . Prenatal Vit-Fe Fumarate-FA (PRENATAL MULTIVITAMIN) TABS tablet Take 1 tablet by mouth at bedtime.    . famotidine (PEPCID) 20 MG tablet Take 1 tablet (20 mg total) by mouth 2 (two) times daily. (Patient not taking: Reported  on 08/30/2015) 30 tablet 0  . ondansetron (ZOFRAN) 4 MG tablet Take 1 tablet (4 mg total) by mouth every 6 (six) hours. (Patient not taking: Reported on 08/30/2015) 12 tablet 0     Review of Systems  Gastrointestinal: Positive for abdominal pain (occasional contraction). Negative for nausea and vomiting.  Genitourinary: Positive for vaginal discharge and pelvic pain (midpelvic). Negative for vaginal bleeding.  All other systems reviewed and are negative.    Physical Exam   Filed Vitals:   08/30/15 1314  BP: 127/73  Pulse: 83  Temp: 98.6 F (37 C)  TempSrc: Oral  Resp: 20  SpO2: 100%    Physical Exam  Constitutional: She is oriented to person, place, and time. She appears well-developed and well-nourished. No distress.  HENT:  Head: Normocephalic.  Neck: Normal range of motion. Neck supple.  Cardiovascular: Normal rate, regular rhythm and normal heart sounds.   Respiratory: Effort normal and breath sounds normal.  GI: Soft. There is no tenderness.  Genitourinary: No bleeding in  the vagina. Vaginal discharge found.  Negative pooling, negative fern  Neurological: She is alert and oriented to person, place, and time.  Skin: Skin is warm and dry.   Dilation: Closed Effacement (%): Thick Cervical Position: Posterior Exam by:: Margarita Mail, CNM  FHR 130's, +accels Toco - occasional MAU Course  Procedures  Results for orders placed or performed during the hospital encounter of 08/30/15 (from the past 24 hour(s))  Wet prep, genital     Status: Abnormal   Collection Time: 08/30/15  2:27 PM  Result Value Ref Range   Yeast Wet Prep HPF POC NONE SEEN NONE SEEN   Trich, Wet Prep NONE SEEN NONE SEEN   Clue Cells Wet Prep HPF POC NONE SEEN NONE SEEN   WBC, Wet Prep HPF POC FEW (A) NONE SEEN  Amnisure rupture of membrane (rom)not at [facility]     Status: None   Collection Time: 08/30/15  2:27 PM  Result Value Ref Range   Amnisure ROM NEGATIVE     1540 Consulted with Dr. Mora Appl >  Reviewed HPI/Exam/fetal monitoring/labs > discharge to home, kick counts, follow-up in office.  Assessment and Plan  29 y.o. [id number] at [redacted]w[redacted]d IUP Reactive NST Vaginal Discharge - tests/exam wnl  Plan: Discharge to home Kick counts Keep scheduled appt   Marlis Edelson, CNM 08/30/2015 3:47 PM

## 2015-09-20 ENCOUNTER — Other Ambulatory Visit: Payer: Self-pay

## 2015-09-20 LAB — OB RESULTS CONSOLE GBS: STREP GROUP B AG: NEGATIVE

## 2015-10-10 ENCOUNTER — Encounter (HOSPITAL_COMMUNITY): Payer: Self-pay | Admitting: Anesthesiology

## 2015-10-10 ENCOUNTER — Encounter (HOSPITAL_COMMUNITY)
Admission: AD | Disposition: A | Discharge status: Home / Self Care | Payer: Self-pay | Source: Ambulatory Visit | Attending: Obstetrics and Gynecology

## 2015-10-10 ENCOUNTER — Inpatient Hospital Stay (HOSPITAL_COMMUNITY)
Admission: AD | Admit: 2015-10-10 | Discharge: 2015-10-13 | DRG: 765 | Disposition: A | Discharge status: Home / Self Care | Payer: Medicaid Other | Source: Ambulatory Visit | Attending: Obstetrics and Gynecology | Admitting: Obstetrics and Gynecology

## 2015-10-10 ENCOUNTER — Inpatient Hospital Stay (HOSPITAL_COMMUNITY): Payer: Medicaid Other | Admitting: Anesthesiology

## 2015-10-10 ENCOUNTER — Encounter (HOSPITAL_COMMUNITY): Payer: Self-pay | Admitting: *Deleted

## 2015-10-10 DIAGNOSIS — Q51810 Arcuate uterus: Secondary | ICD-10-CM

## 2015-10-10 DIAGNOSIS — O328XX0 Maternal care for other malpresentation of fetus, not applicable or unspecified: Secondary | ICD-10-CM | POA: Diagnosis present

## 2015-10-10 DIAGNOSIS — O99212 Obesity complicating pregnancy, second trimester: Secondary | ICD-10-CM

## 2015-10-10 DIAGNOSIS — Z6841 Body Mass Index (BMI) 40.0 and over, adult: Secondary | ICD-10-CM | POA: Diagnosis not present

## 2015-10-10 DIAGNOSIS — J45909 Unspecified asthma, uncomplicated: Secondary | ICD-10-CM | POA: Diagnosis present

## 2015-10-10 DIAGNOSIS — O09899 Supervision of other high risk pregnancies, unspecified trimester: Secondary | ICD-10-CM

## 2015-10-10 DIAGNOSIS — O368130 Decreased fetal movements, third trimester, not applicable or unspecified: Secondary | ICD-10-CM | POA: Diagnosis present

## 2015-10-10 DIAGNOSIS — O99214 Obesity complicating childbirth: Secondary | ICD-10-CM | POA: Diagnosis present

## 2015-10-10 DIAGNOSIS — O9952 Diseases of the respiratory system complicating childbirth: Secondary | ICD-10-CM | POA: Diagnosis present

## 2015-10-10 DIAGNOSIS — O134 Gestational [pregnancy-induced] hypertension without significant proteinuria, complicating childbirth: Principal | ICD-10-CM | POA: Diagnosis present

## 2015-10-10 DIAGNOSIS — O139 Gestational [pregnancy-induced] hypertension without significant proteinuria, unspecified trimester: Secondary | ICD-10-CM | POA: Diagnosis present

## 2015-10-10 DIAGNOSIS — Z8759 Personal history of other complications of pregnancy, childbirth and the puerperium: Secondary | ICD-10-CM

## 2015-10-10 DIAGNOSIS — Z3A38 38 weeks gestation of pregnancy: Secondary | ICD-10-CM

## 2015-10-10 DIAGNOSIS — R51 Headache: Secondary | ICD-10-CM | POA: Diagnosis present

## 2015-10-10 DIAGNOSIS — O280 Abnormal hematological finding on antenatal screening of mother: Secondary | ICD-10-CM

## 2015-10-10 DIAGNOSIS — O1493 Unspecified pre-eclampsia, third trimester: Secondary | ICD-10-CM | POA: Diagnosis present

## 2015-10-10 DIAGNOSIS — R030 Elevated blood-pressure reading, without diagnosis of hypertension: Secondary | ICD-10-CM | POA: Diagnosis present

## 2015-10-10 LAB — CBC
HCT: 35 % — ABNORMAL LOW (ref 36.0–46.0)
HCT: 35.6 % — ABNORMAL LOW (ref 36.0–46.0)
HEMOGLOBIN: 11.6 g/dL — AB (ref 12.0–15.0)
Hemoglobin: 11.7 g/dL — ABNORMAL LOW (ref 12.0–15.0)
MCH: 26.5 pg (ref 26.0–34.0)
MCH: 26.1 pg (ref 26.0–34.0)
MCHC: 33.4 g/dL (ref 30.0–36.0)
MCHC: 32.6 g/dL (ref 30.0–36.0)
MCV: 79.4 fL (ref 78.0–100.0)
MCV: 80.2 fL (ref 78.0–100.0)
PLATELETS: 330 10*3/uL (ref 150–400)
Platelets: 305 K/uL (ref 150–400)
RBC: 4.41 MIL/uL (ref 3.87–5.11)
RBC: 4.44 MIL/uL (ref 3.87–5.11)
RDW: 15 % (ref 11.5–15.5)
RDW: 15.1 % (ref 11.5–15.5)
WBC: 11.5 K/uL — ABNORMAL HIGH (ref 4.0–10.5)
WBC: 11.6 10*3/uL — ABNORMAL HIGH (ref 4.0–10.5)

## 2015-10-10 LAB — COMPREHENSIVE METABOLIC PANEL WITH GFR
ALT: 12 U/L — ABNORMAL LOW (ref 14–54)
AST: 14 U/L — ABNORMAL LOW (ref 15–41)
Albumin: 2.8 g/dL — ABNORMAL LOW (ref 3.5–5.0)
Alkaline Phosphatase: 127 U/L — ABNORMAL HIGH (ref 38–126)
Anion gap: 3 — ABNORMAL LOW (ref 5–15)
BUN: <5 mg/dL — ABNORMAL LOW (ref 6–20)
CO2: 25 mmol/L (ref 22–32)
Calcium: 8.8 mg/dL — ABNORMAL LOW (ref 8.9–10.3)
Chloride: 107 mmol/L (ref 101–111)
Creatinine, Ser: 0.54 mg/dL (ref 0.44–1.00)
GFR calc Af Amer: >60 mL/min
GFR calc non Af Amer: >60 mL/min
Glucose, Bld: 89 mg/dL (ref 65–99)
Potassium: 3.6 mmol/L (ref 3.5–5.1)
Sodium: 135 mmol/L (ref 135–145)
Total Bilirubin: 0.4 mg/dL (ref 0.3–1.2)
Total Protein: 6.8 g/dL (ref 6.5–8.1)

## 2015-10-10 LAB — URINE MICROSCOPIC-ADD ON

## 2015-10-10 LAB — URIC ACID: URIC ACID, SERUM: 3.9 mg/dL (ref 2.3–6.6)

## 2015-10-10 LAB — URINALYSIS, ROUTINE W REFLEX MICROSCOPIC
BILIRUBIN URINE: NEGATIVE
GLUCOSE, UA: NEGATIVE mg/dL
KETONES UR: NEGATIVE mg/dL
Nitrite: NEGATIVE
PH: 6.5 (ref 5.0–8.0)
Protein, ur: NEGATIVE mg/dL
Specific Gravity, Urine: <1.005 — ABNORMAL LOW (ref 1.005–1.030)
Urobilinogen, UA: 0.2 mg/dL (ref 0.0–1.0)

## 2015-10-10 LAB — LACTATE DEHYDROGENASE: LDH: 92 U/L — ABNORMAL LOW (ref 98–192)

## 2015-10-10 LAB — PROTEIN / CREATININE RATIO, URINE
Creatinine, Urine: 31 mg/dL
Total Protein, Urine: <6 mg/dL

## 2015-10-10 LAB — TYPE AND SCREEN
ABO/RH(D): O POS
Antibody Screen: NEGATIVE

## 2015-10-10 SURGERY — Surgical Case
Anesthesia: Epidural

## 2015-10-10 MED ORDER — MEPERIDINE HCL 25 MG/ML IJ SOLN: 6.2500 mg | INTRAMUSCULAR | Status: DC | PRN

## 2015-10-10 MED ORDER — FAMOTIDINE IN NACL 20-0.9 MG/50ML-% IV SOLN
20.0000 mg | Freq: Once | INTRAVENOUS | Status: AC
  Administered 2015-10-10: 20 mg via INTRAVENOUS
  Filled 2015-10-10: qty 50

## 2015-10-10 MED ORDER — LACTATED RINGERS IV SOLN
500.0000 mL | INTRAVENOUS | Status: DC | PRN
  Administered 2015-10-10: 500 mL via INTRAVENOUS

## 2015-10-10 MED ORDER — LIDOCAINE-EPINEPHRINE (PF) 2 %-1:200000 IJ SOLN
INTRAMUSCULAR | Status: AC
  Filled 2015-10-10: qty 20

## 2015-10-10 MED ORDER — ONDANSETRON HCL 4 MG/2ML IJ SOLN
INTRAMUSCULAR | Status: AC
  Filled 2015-10-10: qty 2

## 2015-10-10 MED ORDER — CITRIC ACID-SODIUM CITRATE 334-500 MG/5ML PO SOLN
ORAL | Status: AC
  Administered 2015-10-10: 30 mL
  Filled 2015-10-10: qty 15

## 2015-10-10 MED ORDER — PHENYLEPHRINE 40 MCG/ML (10ML) SYRINGE FOR IV PUSH (FOR BLOOD PRESSURE SUPPORT)
PREFILLED_SYRINGE | INTRAVENOUS | Status: AC
  Filled 2015-10-10: qty 10

## 2015-10-10 MED ORDER — ACETAMINOPHEN 325 MG PO TABS: 650.0000 mg | ORAL_TABLET | ORAL | Status: DC | PRN

## 2015-10-10 MED ORDER — OXYCODONE-ACETAMINOPHEN 5-325 MG PO TABS: 1.0000 | ORAL_TABLET | ORAL | Status: DC | PRN

## 2015-10-10 MED ORDER — MORPHINE SULFATE (PF) 0.5 MG/ML IJ SOLN
INTRAMUSCULAR | Status: DC | PRN
  Administered 2015-10-10: 3000 ug via EPIDURAL

## 2015-10-10 MED ORDER — OXYTOCIN 10 UNIT/ML IJ SOLN
40.0000 [IU] | INTRAVENOUS | Status: DC | PRN
  Administered 2015-10-10: 40 [IU] via INTRAVENOUS

## 2015-10-10 MED ORDER — MEPERIDINE HCL 25 MG/ML IJ SOLN
INTRAMUSCULAR | Status: DC | PRN
  Administered 2015-10-10: 12.5 mg via INTRAVENOUS

## 2015-10-10 MED ORDER — DEXTROSE 5 % IV SOLN
3.0000 g | Freq: Once | INTRAVENOUS | Status: AC
  Administered 2015-10-10 (×2): 3 g via INTRAVENOUS
  Filled 2015-10-10: qty 3000

## 2015-10-10 MED ORDER — BUTORPHANOL TARTRATE 1 MG/ML IJ SOLN: 1.0000 mg | INTRAMUSCULAR | Status: DC | PRN

## 2015-10-10 MED ORDER — EPHEDRINE 5 MG/ML INJ: 10.0000 mg | INTRAVENOUS | Status: DC | PRN

## 2015-10-10 MED ORDER — OXYTOCIN BOLUS FROM INFUSION: 500.0000 mL | INTRAVENOUS | Status: DC

## 2015-10-10 MED ORDER — PROMETHAZINE HCL 25 MG/ML IJ SOLN: 6.2500 mg | INTRAMUSCULAR | Status: DC | PRN

## 2015-10-10 MED ORDER — MORPHINE SULFATE (PF) 0.5 MG/ML IJ SOLN
INTRAMUSCULAR | Status: AC
  Filled 2015-10-10: qty 100

## 2015-10-10 MED ORDER — BUPIVACAINE LIPOSOME 1.3 % IJ SUSP: 20.0000 mL | Freq: Once | INTRAMUSCULAR | Status: DC

## 2015-10-10 MED ORDER — OXYCODONE-ACETAMINOPHEN 5-325 MG PO TABS: 2.0000 | ORAL_TABLET | ORAL | Status: DC | PRN

## 2015-10-10 MED ORDER — TERBUTALINE SULFATE 1 MG/ML IJ SOLN
0.2500 mg | Freq: Once | INTRAMUSCULAR | Status: DC | PRN
Start: 2015-10-10 — End: 2015-10-11

## 2015-10-10 MED ORDER — OXYTOCIN 10 UNIT/ML IJ SOLN
INTRAMUSCULAR | Status: AC
  Filled 2015-10-10: qty 4

## 2015-10-10 MED ORDER — OXYTOCIN 40 UNITS IN LACTATED RINGERS INFUSION - SIMPLE MED
1.0000 m[IU]/min | INTRAVENOUS | Status: DC
  Administered 2015-10-10: 8 m[IU]/min via INTRAVENOUS
  Administered 2015-10-10: 4 m[IU]/min via INTRAVENOUS
  Administered 2015-10-10: 6 m[IU]/min via INTRAVENOUS
  Administered 2015-10-10: 2 m[IU]/min via INTRAVENOUS
  Administered 2015-10-10: 10 m[IU]/min via INTRAVENOUS
  Filled 2015-10-10: qty 1000

## 2015-10-10 MED ORDER — KETOROLAC TROMETHAMINE 30 MG/ML IJ SOLN
INTRAMUSCULAR | Status: AC
  Filled 2015-10-10: qty 1

## 2015-10-10 MED ORDER — LIDOCAINE HCL (PF) 1 % IJ SOLN: 30.0000 mL | INTRAMUSCULAR | Status: DC | PRN

## 2015-10-10 MED ORDER — ZOLPIDEM TARTRATE 5 MG PO TABS: 5.0000 mg | ORAL_TABLET | Freq: Every evening | ORAL | Status: DC | PRN

## 2015-10-10 MED ORDER — KETOROLAC TROMETHAMINE 30 MG/ML IJ SOLN: 30.0000 mg | Freq: Once | INTRAMUSCULAR | Status: AC | PRN

## 2015-10-10 MED ORDER — OXYTOCIN 40 UNITS IN LACTATED RINGERS INFUSION - SIMPLE MED: 62.5000 mL/h | INTRAVENOUS | Status: DC

## 2015-10-10 MED ORDER — LACTATED RINGERS IV SOLN
INTRAVENOUS | Status: DC
  Administered 2015-10-10: 22:00:00 via INTRAVENOUS
  Administered 2015-10-10: 125 mL/h via INTRAVENOUS

## 2015-10-10 MED ORDER — HYDROMORPHONE HCL 1 MG/ML IJ SOLN: 0.2500 mg | INTRAMUSCULAR | Status: DC | PRN

## 2015-10-10 MED ORDER — SCOPOLAMINE 1 MG/3DAYS TD PT72
MEDICATED_PATCH | TRANSDERMAL | Status: AC
  Filled 2015-10-10: qty 1

## 2015-10-10 MED ORDER — PHENYLEPHRINE HCL 10 MG/ML IJ SOLN
INTRAMUSCULAR | Status: DC | PRN
  Administered 2015-10-10 (×3): 40 ug via INTRAVENOUS
  Administered 2015-10-10 (×2): 80 ug via INTRAVENOUS

## 2015-10-10 MED ORDER — SODIUM BICARBONATE 8.4 % IV SOLN
INTRAVENOUS | Status: DC | PRN
  Administered 2015-10-10: 5 mL via EPIDURAL
  Administered 2015-10-10: 10 mL via EPIDURAL

## 2015-10-10 MED ORDER — CITRIC ACID-SODIUM CITRATE 334-500 MG/5ML PO SOLN: 30.0000 mL | ORAL | Status: DC | PRN

## 2015-10-10 MED ORDER — ONDANSETRON HCL 4 MG/2ML IJ SOLN
4.0000 mg | Freq: Four times a day (QID) | INTRAMUSCULAR | Status: DC | PRN
  Administered 2015-10-10: 4 mg via INTRAVENOUS

## 2015-10-10 MED ORDER — KETOROLAC TROMETHAMINE 30 MG/ML IJ SOLN: 30.0000 mg | Freq: Four times a day (QID) | INTRAMUSCULAR | Status: DC | PRN

## 2015-10-10 MED ORDER — SODIUM BICARBONATE 8.4 % IV SOLN
INTRAVENOUS | Status: AC
  Filled 2015-10-10: qty 50

## 2015-10-10 MED ORDER — KETOROLAC TROMETHAMINE 30 MG/ML IJ SOLN
30.0000 mg | Freq: Four times a day (QID) | INTRAMUSCULAR | Status: DC | PRN
  Administered 2015-10-10: 30 mg via INTRAMUSCULAR

## 2015-10-10 MED ORDER — DIPHENHYDRAMINE HCL 50 MG/ML IJ SOLN: 12.5000 mg | INTRAMUSCULAR | Status: DC | PRN

## 2015-10-10 MED ORDER — MEPERIDINE HCL 25 MG/ML IJ SOLN
INTRAMUSCULAR | Status: AC
  Filled 2015-10-10: qty 1

## 2015-10-10 MED ORDER — LIDOCAINE HCL (PF) 1 % IJ SOLN
INTRAMUSCULAR | Status: DC | PRN
  Administered 2015-10-10 (×2): 4 mL

## 2015-10-10 MED ORDER — BUPIVACAINE HCL (PF) 0.25 % IJ SOLN
INTRAMUSCULAR | Status: AC
  Filled 2015-10-10: qty 30

## 2015-10-10 MED ORDER — FENTANYL 2.5 MCG/ML BUPIVACAINE 1/10 % EPIDURAL INFUSION (WH - ANES)
14.0000 mL/h | INTRAMUSCULAR | Status: DC | PRN
  Administered 2015-10-10 (×2): 14 mL/h via EPIDURAL
  Filled 2015-10-10: qty 125

## 2015-10-10 MED ORDER — DEXTROSE 5 % IV SOLN
INTRAVENOUS | Status: AC
  Filled 2015-10-10: qty 3000

## 2015-10-10 MED ORDER — PHENYLEPHRINE 40 MCG/ML (10ML) SYRINGE FOR IV PUSH (FOR BLOOD PRESSURE SUPPORT)
80.0000 ug | PREFILLED_SYRINGE | INTRAVENOUS | Status: DC | PRN
  Filled 2015-10-10: qty 20

## 2015-10-10 SURGICAL SUPPLY — 31 items
BARRIER ADHS 3X4 INTERCEED (GAUZE/BANDAGES/DRESSINGS) ×3 IMPLANT
CLAMP CORD UMBIL (MISCELLANEOUS) IMPLANT
CLOTH BEACON ORANGE TIMEOUT ST (SAFETY) ×3 IMPLANT
DRAPE SHEET LG 3/4 BI-LAMINATE (DRAPES) IMPLANT
DRSG OPSITE POSTOP 4X10 (GAUZE/BANDAGES/DRESSINGS) ×3 IMPLANT
DURAPREP 26ML APPLICATOR (WOUND CARE) ×3 IMPLANT
ELECT REM PT RETURN 9FT ADLT (ELECTROSURGICAL) ×3
ELECTRODE REM PT RTRN 9FT ADLT (ELECTROSURGICAL) ×1 IMPLANT
EXTRACTOR VACUUM M CUP 4 TUBE (SUCTIONS) IMPLANT
EXTRACTOR VACUUM M CUP 4' TUBE (SUCTIONS)
GLOVE BIO SURGEON STRL SZ7 (GLOVE) ×3 IMPLANT
GOWN STRL REUS W/TWL LRG LVL3 (GOWN DISPOSABLE) ×6 IMPLANT
KIT ABG SYR 3ML LUER SLIP (SYRINGE) IMPLANT
LIQUID BAND (GAUZE/BANDAGES/DRESSINGS) ×3 IMPLANT
NEEDLE HYPO 22GX1.5 SAFETY (NEEDLE) IMPLANT
NEEDLE HYPO 25X5/8 SAFETYGLIDE (NEEDLE) IMPLANT
NS IRRIG 1000ML POUR BTL (IV SOLUTION) ×3 IMPLANT
PACK C SECTION WH (CUSTOM PROCEDURE TRAY) ×3 IMPLANT
PAD OB MATERNITY 4.3X12.25 (PERSONAL CARE ITEMS) ×3 IMPLANT
PENCIL SMOKE EVAC W/HOLSTER (ELECTROSURGICAL) ×3 IMPLANT
RTRCTR C-SECT PINK 25CM LRG (MISCELLANEOUS) ×3 IMPLANT
SPONGE LAP 18X18 X RAY DECT (DISPOSABLE) ×6 IMPLANT
SUT CHROMIC 1 CTX 36 (SUTURE) ×6 IMPLANT
SUT CHROMIC 2 0 CT 1 (SUTURE) ×3 IMPLANT
SUT PDS AB 0 CTX 60 (SUTURE) ×3 IMPLANT
SUT VIC AB 2-0 CT1 27 (SUTURE) ×2
SUT VIC AB 2-0 CT1 TAPERPNT 27 (SUTURE) ×1 IMPLANT
SUT VIC AB 4-0 KS 27 (SUTURE) IMPLANT
SYR 30ML LL (SYRINGE) IMPLANT
TOWEL OR 17X24 6PK STRL BLUE (TOWEL DISPOSABLE) ×3 IMPLANT
TRAY FOLEY CATH SILVER 14FR (SET/KITS/TRAYS/PACK) ×3 IMPLANT

## 2015-10-10 NOTE — Progress Notes (Signed)
Patient ID: Kristy GreenerMelinda Tucker, female   DOB: November 06, 1986, 29 y.o.   MRN: [id number]  S: Still comfortable O: AFVSS Cvx deferred toco Q2 FHR 150, reactive, cat 1 tracing Bedside US shows persistant transverse presentation, now favoring frank breech  Will proceed with primary Cesarean section. R/B/A discussed Pt has significant trauma to skin from pre-operative clipping. Will dose her Ancef 3 gram now so that antibiotics will be in for some time prior to skin incision.

## 2015-10-10 NOTE — Transfer of Care (Signed)
Immediate Anesthesia Transfer of Care Note  Patient: Kristy GreenerMelinda Tucker  Procedure(s) Performed: Procedure(s): CESAREAN SECTION (N/A)  Patient Location: PACU  Anesthesia Type:Epidural  Level of Consciousness: awake, alert  and oriented  Airway & Oxygen Therapy: Patient Spontanous Breathing  Post-op Assessment: Report given to RN and Post -op Vital signs reviewed and stable  Post vital signs: Reviewed and stable  Last Vitals:  Filed Vitals:   10/10/15 1930  BP:   Pulse:   Temp:   Resp: 16    Complications: No apparent anesthesia complications

## 2015-10-10 NOTE — Op Note (Signed)
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 Waynard Reeds Assistant: None Operative Findings: female infant in the double footling breech presentation with Apgar scores of 9 at 1 minute and 9 at 5 minutes. Normal bilateral ovaries and tubes. The lower uterine segment was poorly developed. An attempt at a low transverse incision was made, however persistent a Bandl's ring constriction required T extension of the uterine incision. Given the T extension of the incision I advised the patient that she was NOT a candidate for trial of labor in future pregnancies. The uterus was noted to be arcuate shaped. Specimen: EBL: Total I/O In: 1450 [I.V.:1400; IV Piggyback:50] Out: 1050 [Urine:350; Blood:700]   Procedure:[name] is an 29 year old gravida 6 para 2031 at 47 weeks and 9 days estimated gestational age who presents for cesarean section. The patient was admitted for induction of labor for gestational hypertension. A Foley catheter was placed transcervically without difficulty. At the time of Foley catheter placement the patient was vertex presentation. After the Foley catheter came out the patient was noted to be 4 cm dilated. Upon palpation of the presenting part small parts were felt and an ultrasound was performed which confirmed transverse backup lie with feet overlying the cervix. Multiple attempts were made to externally vert the patient, however these were not successful. Given the patient had eaten the decision was made to wait several hours until nothing by mouth status was adequate. A repeat ultrasound was performed prior to going back to the OR and showed the fetus was now in the double footling breech presentation. Therefore the  decision was made to proceed with cesarean section. Following the appropriate informed consent the patient was brought to the operating room where epidural anesthesia was administered and found to be adequate. She was placed in the dorsal supine position with a leftward tilt. She was prepped and draped in the normal sterile fashion. Scalpel was then used to make a Pfannenstiel skin incision which was carried down to the underlying layers of soft tissue to the fascia. The fascia was incised in the midline and the fascial incision was extended laterally with Mayo scissors. The superior aspect of the fascial incision was grasped with Coker clamps x2, tented up and the rectus muscles dissected off sharply with the electrocautery unit area and the same procedure was repeated on the inferior aspect of the fascial incision. The rectus muscles were separated in the midline. The abdominal peritoneum was identified, tented up, entered sharply, and the incision was extended superiorly and inferiorly with good visualization of the bladder. The Alexis retractor was then deployed. The vesicouterine peritoneum was identified, tented up, entered sharply, and the bladder flap was created digitally. Scalpel was then used to make a low transverse incision on the uterus which was extended laterally with blunt dissection. The feet were at first encountered, grasped, and delivered through the low transverse incision to the level of the buttocks. The buttocks did not deliver. 3 attempts were made to deliver past the constriction in the uterus and were not successful. At this time the decision was made to extend the uterine incision in a T fashion. The infant was then delivered easily using standard breech maneuvers. The cord was clamped and cut, the infant was dried and cried vigorously on the operative field, and was passed to the  waiting neonatology team. The placenta was then delivered manually due to cord avulsion. The uterus was  cleared of all clot and debris. The low transverse incision was repaired with #1 chromic in a running locked fashion. The T extension of the uterine incision was repaired with #1 chromic in a running locked fashion followed by a second imbricating layer of #1 chromic and 3-0 Vicryl on the serosa in a baseball stitch fashion.the peritoneum was then reapproximated with 2-0 Vicryl in a running fashion the muscles were reapproximated with 2-0 chromic in a running fashion. The fascia was closed with a looped PDS in a running fashion. Subcuticular tissue was closed with 20 plain gut interrupted sutures and the skin was closed with 4-0 Vicryl in a subcuticular fashion followed by surgical skin glue. All sponge, lap, needle counts correct 3. The patient tolerated the procedure well and mom and baby were transferred to the postanesthesia care unit in good condition

## 2015-10-10 NOTE — MAU Note (Addendum)
C/o decreased fetal movement for past 3-4 days; pt had stillborn last year; she is scheduled for induction this Sunday (6 days away);

## 2015-10-10 NOTE — Progress Notes (Signed)
Patient ID: Clyda GreenerMelinda Heinrich, female   DOB: 1986/01/27, 29 y.o.   MRN: [id number]  S: Pt comfortable with epidural O: Filed Vitals:   10/10/15 1718 10/10/15 1722 10/10/15 1723 10/10/15 1728  BP:  134/74    Pulse: 84 86 91 90  Temp:    97.8 F (36.6 C)  TempSrc:    Oral  Resp:    16  Height:      Weight:      SpO2: 100%  100% 100%   AOX3 Cxv 4/50/-2-3 ? Hand presentation FHT 140s reactive category 1 tracing  TAUS shows transverse back up presentation with feet over the cervix.Pitocin stopped.  Multiple attempts to externally vert the baby were not successful. Patient last ate at 2:15. Husband is not here. Will re-assess presentation when he arrives. If presentation remains transverse will proceed with cesarean.

## 2015-10-10 NOTE — Progress Notes (Signed)
Patient ID: Kristy Tucker, female   DOB: 1986-03-30, 29 y.o.   MRN: [id number]  S: Comfortable, headache persists O: Filed Vitals:   10/10/15 1159  BP: 132/82  Pulse: 81  Temp:   Resp: 16   Cvx 1/th/-2 FHR 150s reactive, cat 1 tracing toco Quiet  Foley placed through cervix without difficulty Will start pit FWB reassuring

## 2015-10-10 NOTE — MAU Provider Note (Signed)
S:   [name] is a 29 y.o. femele [phone] @ 54w0dpresenting with decreased fetal movement and new onset of HA in the last 24 hours. The patient feels that her baby is not moving as much as it has been. She has had HA in the past however not with this pregnancy.   This headache is new and mild.   She denies vaginal bleeding, denies leaking of fluid.   She is scheduled for induction on 10/17/15   O:  GENERAL: Well-developed, well-nourished female in no acute distress.  LUNGS: Effort normal SKIN: Warm, dry and without erythema PSYCH: Normal mood and affect  Filed Vitals:   10/10/15 0813  BP: 147/86  Pulse: 88  Temp:   Resp: 18   Patient Vitals for the past 24 hrs:  BP Temp Temp src Pulse Resp Height Weight  10/10/15 0930 131/81 mmHg - - 96 - - -  10/10/15 0915 134/79 mmHg - - 89 - - -  10/10/15 0910 147/82 mmHg - - 70 - - -  10/10/15 0814 147/86 mmHg - - 95 - - -  10/10/15 0813 147/86 mmHg - - 88 18 - -  10/10/15 0803 145/81 mmHg - - 102 - - -  10/10/15 0802 145/81 mmHg 97.8 F (36.6 C) Oral 97 18 5' 8"  (1.727 m) 281 lb (127.461 kg)   Results for orders placed or performed during the hospital encounter of 10/10/15 (from the past 48 hour(s))  Protein / creatinine ratio, urine     Status: None   Collection Time: 10/10/15  7:50 AM  Result Value Ref Range   Creatinine, Urine 31.00 mg/dL   Total Protein, Urine <6 mg/dL    Comment: REPEATED TO VERIFY NO NORMAL RANGE ESTABLISHED FOR THIS TEST    Protein Creatinine Ratio        0.00 - 0.15 mg/mg[Cre]    Comment: RESULT BELOW REPORTABLE RANGE, UNABLE TO CALCULATE.   Urinalysis, [facility]     Status: Abnormal   Collection Time: 10/10/15  7:50 AM  Result Value Ref Range   Color, Urine YELLOW YELLOW   APPearance CLEAR CLEAR   Specific Gravity, Urine <1.005 (L) 1.005 - 1.030   pH 6.5 5.0 - 8.0   Glucose, UA NEGATIVE NEGATIVE mg/dL   Hgb urine dipstick TRACE (A) NEGATIVE   Bilirubin  Urine NEGATIVE NEGATIVE   Ketones, ur NEGATIVE NEGATIVE mg/dL   Protein, ur NEGATIVE NEGATIVE mg/dL   Urobilinogen, UA 0.2 0.0 - 1.0 mg/dL   Nitrite NEGATIVE NEGATIVE   Leukocytes, UA SMALL (A) NEGATIVE  Urine microscopic-add on     Status: Abnormal   Collection Time: 10/10/15  7:50 AM  Result Value Ref Range   Squamous Epithelial / LPF FEW (A) RARE   WBC, UA 3-6 <3 WBC/hpf   RBC / HPF 0-2 <3 RBC/hpf   Bacteria, UA MANY (A) RARE   Urine-Other MUCOUS PRESENT   CBC     Status: Abnormal   Collection Time: 10/10/15  9:25 AM  Result Value Ref Range   WBC 11.5 (H) 4.0 - 10.5 K/uL   RBC 4.41 3.87 - 5.11 MIL/uL   Hemoglobin 11.7 (L) 12.0 - 15.0 g/dL   HCT 35.0 (L) 36.0 - 46.0 %   MCV 79.4 78.0 - 100.0 fL   MCH 26.5 26.0 - 34.0 pg   MCHC 33.4 30.0 - 36.0 g/dL   RDW 15.0 11.5 - 15.5 %   Platelets 305 150 -  400 K/uL  Comprehensive metabolic panel     Status: Abnormal   Collection Time: 10/10/15  9:25 AM  Result Value Ref Range   Sodium 135 135 - 145 mmol/L    Comment: REPEATED TO VERIFY   Potassium 3.6 3.5 - 5.1 mmol/L   Chloride 107 101 - 111 mmol/L    Comment: REPEATED TO VERIFY   CO2 25 22 - 32 mmol/L    Comment: REPEATED TO VERIFY   Glucose, Bld 89 65 - 99 mg/dL   BUN <5 (L) 6 - 20 mg/dL    Comment: REPEATED TO VERIFY   Creatinine, Ser 0.54 0.44 - 1.00 mg/dL   Calcium 8.8 (L) 8.9 - 10.3 mg/dL   Total Protein 6.8 6.5 - 8.1 g/dL   Albumin 2.8 (L) 3.5 - 5.0 g/dL   AST 14 (L) 15 - 41 U/L   ALT 12 (L) 14 - 54 U/L   Alkaline Phosphatase 127 (H) 38 - 126 U/L   Total Bilirubin 0.4 0.3 - 1.2 mg/dL   GFR calc non Af Amer >60 >60 mL/min   GFR calc Af Amer >60 >60 mL/min    Comment: (NOTE) The eGFR has been calculated using the CKD EPI equation. This calculation has not been validated in all clinical situations. eGFR's persistently <60 mL/min signify possible Chronic Kidney Disease.    Anion gap 3 (L) 5 - 15    Comment: REPEATED TO VERIFY  Uric acid     Status: None    Collection Time: 10/10/15  9:25 AM  Result Value Ref Range   Uric Acid, Serum 3.9 2.3 - 6.6 mg/dL  Lactate dehydrogenase     Status: Abnormal   Collection Time: 10/10/15  9:25 AM  Result Value Ref Range   LDH 92 (L) 98 - 192 U/L   Fetal Tracing: Baseline: 130 bpm  Variability: Moderate  Accelerations:  15x15 Decelerations: None Toco: 2-6; irregular pattern.   MDM:  Discussed fetal tracing with Dr. Harrington Challenger Elevated BP readings noted; [place] labs drawn and pending.  Discussed labs with Ross.    A:  Superimposed preeclampsia> elevated BP with new onset HA.  History of term IUFD  P:  Admit to Labor and Delivery Patient made aware    Lezlie Lye, NP  10/10/2015 9:04 AM

## 2015-10-10 NOTE — Consult Note (Signed)
Neonatology Note:   Attendance at C-section:    I was asked by Dr. Tenny Crawoss to attend this primary C/S at 38 weeks due to malpresentation. The mother is a Z6X0R6G6P2A3 O pos, GBS neg with gestational HTN, on Labetalol. ROM at delivery, fluid clear. Delivered breech. Infant vigorous with good spontaneous cry and tone. Needed only minimal bulb suctioning. Ap 9/9. Lungs clear to ausc in DR. Noted to have a hooded foreskin, although meatus appears to be normally placed; if any hypospadias is present, it is minimal. [place] To CN to care of Pediatrician.   Doretha Souhristie C. Tinaya Ceballos, MD

## 2015-10-10 NOTE — MAU Note (Signed)
Urine in lab 

## 2015-10-10 NOTE — H&P (Signed)
Kristy Tucker is a 29 y.o. female presenting for decreased fetal movement  29 yo Z6X0960G6P2021 @ 38+0 presents for decreased fetal movment. Her pregnancy has been followed closely for a h/o a 41 week IUD with her last pregnancy. NST in MAU was reactive, cat 1 tracing, however she was noted to have elevated Blood pressures with headache over the last 24 hours not responsive to Tylenol. Therefore the decision was made to proceed with IOL for gestational hypertension. PIH labs WNL  History OB History    Gravida Para Term Preterm AB TAB SAB Ectopic Multiple Living   6 2 2  3 2 1   1      Past Medical History  Diagnosis Date  . Asthma    Past Surgical History  Procedure Laterality Date  . Bartholin gland cyst excision     Family History: family history includes Asthma in her brother; Diabetes in her maternal uncle; Hearing loss in her mother. Social History:  reports that she has never smoked. She does not have any smokeless tobacco history on file. She reports that she does not drink alcohol or use illicit drugs.   Prenatal Transfer Tool  Maternal Diabetes: No Genetic Screening: Normal Maternal Ultrasounds/Referrals: Normal Fetal Ultrasounds or other Referrals:  None Maternal Substance Abuse:  No Significant Maternal Medications:  None Significant Maternal Lab Results:  None Other Comments:  None  ROS: as above  Dilation: 1 Effacement (%): Thick Station: -2 Exam by:: Emiko Osorto Blood pressure 132/82, pulse 81, temperature 97.8 F (36.6 C), temperature source Oral, resp. rate 16, height 5\' 8"  (1.727 m), weight 281 lb (127.461 kg), last menstrual period 01/06/2015. Exam Physical Exam  Prenatal labs: ABO, Rh: --/--/O POS (03/07 2237) Antibody: NEG (10/31 0930) Rubella:  immune RPR: Non Reactive (03/07 2237)  HBsAg:   NR HIV: Non-reactive (04/25 0000)  GBS: Negative (09/27 0000)   Assessment/Plan: 1) Admit 2) Induction for gestational hypertension 3) Labetalol IV prn severe range  BPs   Quincy Prisco H. 10/10/2015, 1:08 PM

## 2015-10-10 NOTE — Anesthesia Procedure Notes (Signed)
Epidural Patient location during procedure: OB  Staffing Anesthesiologist: Rage Beever Performed by: anesthesiologist   Preanesthetic Checklist Completed: patient identified, site marked, surgical consent, pre-op evaluation, timeout performed, IV checked, risks and benefits discussed and monitors and equipment checked  Epidural Patient position: sitting Prep: site prepped and draped and DuraPrep Patient monitoring: continuous pulse ox and blood pressure Approach: midline Location: L3-L4 Injection technique: LOR saline  Needle:  Needle type: Tuohy  Needle gauge: 17 G Needle length: 9 cm and 9 Needle insertion depth: 9 cm Catheter type: closed end flexible Catheter size: 19 Gauge Catheter at skin depth: 14 cm Test dose: negative  Assessment Events: blood not aspirated, injection not painful, no injection resistance, negative IV test and no paresthesia  Additional Notes Patient identified. Risks/Benefits/Options discussed with patient including but not limited to bleeding, infection, nerve damage, paralysis, failed block, incomplete pain control, headache, blood pressure changes, nausea, vomiting, reactions to medication both or allergic, itching and postpartum back pain. Confirmed with bedside nurse the patient's most recent platelet count. Confirmed with patient that they are not currently taking any anticoagulation, have any bleeding history or any family history of bleeding disorders. Patient expressed understanding and wished to proceed. All questions were answered. Sterile technique was used throughout the entire procedure. Please see nursing notes for vital signs. Test dose was given through epidural catheter and negative prior to continuing to dose epidural or start infusion. Warning signs of high block given to the patient including shortness of breath, tingling/numbness in hands, complete motor block, or any concerning symptoms with instructions to call for help. Patient was  given instructions on fall risk and not to get out of bed. All questions and concerns addressed with instructions to call with any issues or inadequate analgesia.      

## 2015-10-10 NOTE — Anesthesia Preprocedure Evaluation (Addendum)
Anesthesia Evaluation  Patient identified by MRN, date of birth, ID band Patient awake    Reviewed: Allergy & Precautions, NPO status , Patient's Chart, lab work & pertinent test results  History of Anesthesia Complications Negative for: history of anesthetic complications  Airway Mallampati: II  TM Distance: >3 FB Neck ROM: Full    Dental no notable dental hx. (+) Dental Advisory Given   Pulmonary asthma ,    Pulmonary exam normal breath sounds clear to auscultation       Cardiovascular hypertension, Normal cardiovascular exam Rhythm:Regular Rate:Normal     Neuro/Psych negative neurological ROS  negative psych ROS   GI/Hepatic negative GI ROS, Neg liver ROS,   Endo/Other  Morbid obesity  Renal/GU negative Renal ROS  negative genitourinary   Musculoskeletal negative musculoskeletal ROS (+)   Abdominal   Peds negative pediatric ROS (+)  Hematology negative hematology ROS (+)   Anesthesia Other Findings   Reproductive/Obstetrics (+) Pregnancy                             Anesthesia Physical Anesthesia Plan  ASA: III  Anesthesia Plan: Epidural   Post-op Pain Management:    Induction:   Airway Management Planned:   Additional Equipment:   Intra-op Plan:   Post-operative Plan:   Informed Consent: I have reviewed the patients History and Physical, chart, labs and discussed the procedure including the risks, benefits and alternatives for the proposed anesthesia with the patient or authorized representative who has indicated his/her understanding and acceptance.     Plan Discussed with: CRNA and Surgeon  Anesthesia Plan Comments: (Pt undergoing C/S with functional labor epidural.)       Anesthesia Quick Evaluation

## 2015-10-11 ENCOUNTER — Encounter (HOSPITAL_COMMUNITY): Payer: Self-pay | Admitting: Obstetrics and Gynecology

## 2015-10-11 LAB — CBC
HEMATOCRIT: 28.7 % — AB (ref 36.0–46.0)
HEMOGLOBIN: 9.6 g/dL — AB (ref 12.0–15.0)
MCH: 26.3 pg (ref 26.0–34.0)
MCHC: 33.4 g/dL (ref 30.0–36.0)
MCV: 78.6 fL (ref 78.0–100.0)
Platelets: 272 10*3/uL (ref 150–400)
RBC: 3.65 MIL/uL — AB (ref 3.87–5.11)
RDW: 15 % (ref 11.5–15.5)
WBC: 17.1 10*3/uL — ABNORMAL HIGH (ref 4.0–10.5)

## 2015-10-11 LAB — RPR: RPR: NONREACTIVE

## 2015-10-11 MED ORDER — SIMETHICONE 80 MG PO CHEW
80.0000 mg | CHEWABLE_TABLET | Freq: Three times a day (TID) | ORAL | Status: DC
Start: 2015-10-11 — End: 2015-10-13
  Administered 2015-10-11 – 2015-10-13 (×6): 80 mg via ORAL
  Filled 2015-10-11 (×6): qty 1

## 2015-10-11 MED ORDER — ACETAMINOPHEN 500 MG PO TABS
1000.0000 mg | ORAL_TABLET | Freq: Four times a day (QID) | ORAL | Status: AC
  Administered 2015-10-11 (×3): 1000 mg via ORAL
  Filled 2015-10-11 (×3): qty 2

## 2015-10-11 MED ORDER — DIPHENHYDRAMINE HCL 25 MG PO CAPS: 25.0000 mg | ORAL_CAPSULE | Freq: Four times a day (QID) | ORAL | Status: DC | PRN

## 2015-10-11 MED ORDER — NALOXONE HCL 0.4 MG/ML IJ SOLN: 0.4000 mg | INTRAMUSCULAR | Status: DC | PRN

## 2015-10-11 MED ORDER — MENTHOL 3 MG MT LOZG: 1.0000 | LOZENGE | OROMUCOSAL | Status: DC | PRN

## 2015-10-11 MED ORDER — LANOLIN HYDROUS EX OINT: 1.0000 "application " | TOPICAL_OINTMENT | CUTANEOUS | Status: DC | PRN

## 2015-10-11 MED ORDER — INFLUENZA VAC SPLIT QUAD 0.5 ML IM SUSY
0.5000 mL | PREFILLED_SYRINGE | INTRAMUSCULAR | Status: AC
  Administered 2015-10-12: 0.5 mL via INTRAMUSCULAR
  Filled 2015-10-11: qty 0.5

## 2015-10-11 MED ORDER — SIMETHICONE 80 MG PO CHEW: 80.0000 mg | CHEWABLE_TABLET | ORAL | Status: DC | PRN

## 2015-10-11 MED ORDER — OXYCODONE-ACETAMINOPHEN 5-325 MG PO TABS
1.0000 | ORAL_TABLET | ORAL | Status: DC | PRN
  Administered 2015-10-12 – 2015-10-13 (×2): 1 via ORAL
  Filled 2015-10-11 (×2): qty 1

## 2015-10-11 MED ORDER — NALOXONE HCL 1 MG/ML IJ SOLN
1.0000 ug/kg/h | INTRAVENOUS | Status: DC | PRN
  Filled 2015-10-11: qty 2

## 2015-10-11 MED ORDER — DIBUCAINE 1 % RE OINT: 1.0000 "application " | TOPICAL_OINTMENT | RECTAL | Status: DC | PRN

## 2015-10-11 MED ORDER — NALBUPHINE HCL 10 MG/ML IJ SOLN: 5.0000 mg | Freq: Once | INTRAMUSCULAR | Status: DC | PRN

## 2015-10-11 MED ORDER — WITCH HAZEL-GLYCERIN EX PADS: 1.0000 "application " | MEDICATED_PAD | CUTANEOUS | Status: DC | PRN

## 2015-10-11 MED ORDER — ALBUTEROL SULFATE (2.5 MG/3ML) 0.083% IN NEBU: 3.0000 mL | INHALATION_SOLUTION | Freq: Four times a day (QID) | RESPIRATORY_TRACT | Status: DC | PRN

## 2015-10-11 MED ORDER — SIMETHICONE 80 MG PO CHEW
80.0000 mg | CHEWABLE_TABLET | ORAL | Status: DC
Start: 2015-10-11 — End: 2015-10-13
  Administered 2015-10-11 – 2015-10-12 (×3): 80 mg via ORAL
  Filled 2015-10-11 (×3): qty 1

## 2015-10-11 MED ORDER — ACETAMINOPHEN 325 MG PO TABS
650.0000 mg | ORAL_TABLET | ORAL | Status: DC | PRN
  Administered 2015-10-12: 650 mg via ORAL
  Filled 2015-10-11: qty 2

## 2015-10-11 MED ORDER — IBUPROFEN 600 MG PO TABS
600.0000 mg | ORAL_TABLET | Freq: Four times a day (QID) | ORAL | Status: DC | PRN
  Administered 2015-10-12: 600 mg via ORAL
  Filled 2015-10-11: qty 1

## 2015-10-11 MED ORDER — LACTATED RINGERS IV SOLN
INTRAVENOUS | Status: DC
  Administered 2015-10-11: 09:00:00 via INTRAVENOUS

## 2015-10-11 MED ORDER — DIPHENHYDRAMINE HCL 50 MG/ML IJ SOLN: 12.5000 mg | INTRAMUSCULAR | Status: DC | PRN

## 2015-10-11 MED ORDER — PRENATAL MULTIVITAMIN CH
1.0000 | ORAL_TABLET | Freq: Every day | ORAL | Status: DC
  Administered 2015-10-11 – 2015-10-12 (×2): 1 via ORAL
  Filled 2015-10-11 (×2): qty 1

## 2015-10-11 MED ORDER — ZOLPIDEM TARTRATE 5 MG PO TABS: 5.0000 mg | ORAL_TABLET | Freq: Every evening | ORAL | Status: DC | PRN

## 2015-10-11 MED ORDER — OXYCODONE-ACETAMINOPHEN 5-325 MG PO TABS
2.0000 | ORAL_TABLET | ORAL | Status: DC | PRN
  Administered 2015-10-13: 2 via ORAL
  Filled 2015-10-11: qty 2

## 2015-10-11 MED ORDER — TETANUS-DIPHTH-ACELL PERTUSSIS 5-2.5-18.5 LF-MCG/0.5 IM SUSP: 0.5000 mL | Freq: Once | INTRAMUSCULAR | Status: DC

## 2015-10-11 MED ORDER — SODIUM CHLORIDE 0.9 % IJ SOLN: 3.0000 mL | INTRAMUSCULAR | Status: DC | PRN

## 2015-10-11 MED ORDER — SCOPOLAMINE 1 MG/3DAYS TD PT72
1.0000 | MEDICATED_PATCH | Freq: Once | TRANSDERMAL | Status: DC
  Filled 2015-10-11: qty 1

## 2015-10-11 MED ORDER — NALBUPHINE HCL 10 MG/ML IJ SOLN: 5.0000 mg | INTRAMUSCULAR | Status: DC | PRN

## 2015-10-11 MED ORDER — IBUPROFEN 600 MG PO TABS
600.0000 mg | ORAL_TABLET | Freq: Four times a day (QID) | ORAL | Status: DC
  Administered 2015-10-11 – 2015-10-13 (×9): 600 mg via ORAL
  Filled 2015-10-11 (×9): qty 1

## 2015-10-11 MED ORDER — ONDANSETRON HCL 4 MG/2ML IJ SOLN: 4.0000 mg | Freq: Three times a day (TID) | INTRAMUSCULAR | Status: DC | PRN

## 2015-10-11 MED ORDER — DIPHENHYDRAMINE HCL 25 MG PO CAPS: 25.0000 mg | ORAL_CAPSULE | ORAL | Status: DC | PRN

## 2015-10-11 MED ORDER — OXYTOCIN 40 UNITS IN LACTATED RINGERS INFUSION - SIMPLE MED: 62.5000 mL/h | INTRAVENOUS | Status: AC

## 2015-10-11 MED ORDER — SENNOSIDES-DOCUSATE SODIUM 8.6-50 MG PO TABS
2.0000 | ORAL_TABLET | ORAL | Status: DC
  Administered 2015-10-11 – 2015-10-12 (×3): 2 via ORAL
  Filled 2015-10-11 (×3): qty 2

## 2015-10-11 NOTE — Progress Notes (Signed)
POD#1 Pt with out complaints. Pt reports that nursing service told her that the baby had a hypospadius. VSSAF IMP/ Stable Plan/ Routine care.

## 2015-10-11 NOTE — Anesthesia Postprocedure Evaluation (Signed)
  Anesthesia Post-op Note  Patient: Kristy GreenerMelinda Tucker  Procedure(s) Performed: Procedure(s): CESAREAN SECTION (N/A)  Patient Location: Mother/Baby  Anesthesia Type:Epidural  Level of Consciousness: awake, alert , oriented and patient cooperative  Airway and Oxygen Therapy: Patient Spontanous Breathing  Post-op Pain: mild  Post-op Assessment: Post-op Vital signs reviewed, Patient's Cardiovascular Status Stable, Respiratory Function Stable, Patent Airway, No signs of Nausea or vomiting, Adequate PO intake, Pain level controlled, No headache, No backache and Patient able to bend at knees   LLE Sensation: Tingling   RLE Sensation: Tingling L Sensory Level: L2-Upper inner thigh, upper buttock R Sensory Level: L1-Inguinal (groin) region  Post-op Vital Signs: Reviewed and stable  Last Vitals:  Filed Vitals:   10/11/15 0455  BP: 115/64  Pulse: 73  Temp: 36.8 C  Resp: 18    Complications: No apparent anesthesia complications

## 2015-10-11 NOTE — Addendum Note (Signed)
Addendum  created 10/11/15 0813 by Collier FlowersElizabeth J Fabian Coca, CRNA   Modules edited: Notes Section   Notes Section:  File: [id number]

## 2015-10-11 NOTE — Lactation Note (Signed)
This note was copied from the chart of Kristy Clyda GreenerMelinda Tucker. Lactation Consultation Note  Patient Name: Kristy Tucker GEXBM'WToday's Date: 10/11/2015 Reason for consult: Follow-up assessment;Difficult latch;Breast/nipple pain  BF mom requested help with latch. She was able to latch baby to the flat side and he was doing well so she did try the inverted side at the feeding. She is not using the shield, shells, or pump at this time. She stated that she just wants to latch baby and pump later if she needs to. She is aware of O/P services and support group. She will page for help as needed.   Maternal Data Has patient been taught Hand Expression?: Yes  Feeding Feeding Type: Breast Fed Length of feed: 15 min  LATCH Score/Interventions Latch: Repeated attempts needed to sustain latch, nipple held in mouth throughout feeding, stimulation needed to elicit sucking reflex. Intervention(s): Breast massage;Breast compression  Audible Swallowing: Spontaneous and intermittent Intervention(s): Hand expression;Skin to skin  Type of Nipple: Flat Intervention(s): Hand pump  Comfort (Breast/Nipple): Soft / non-tender     Hold (Positioning): Assistance needed to correctly position infant at breast and maintain latch. Intervention(s): Support Pillows;Position options  LATCH Score: 7  Lactation Tools Discussed/Used     Consult Status Consult Status: Follow-up Date: 10/12/15 Follow-up type: In-patient    Rulon Eisenmengerlizabeth E Averie Meiner 10/11/2015, 10:44 PM

## 2015-10-11 NOTE — Anesthesia Postprocedure Evaluation (Signed)
Anesthesia Post Note  Patient: Kristy GreenerMelinda Tucker  Procedure(s) Performed: Procedure(s) (LRB): CESAREAN SECTION (N/A)  Anesthesia type: Epidural  Patient location: PACU  Post pain: Pain level controlled  Post assessment: Post-op Vital signs reviewed  Last Vitals:  Filed Vitals:   10/11/15 0000  BP:   Pulse:   Temp: 36.7 C  Resp:     Post vital signs: Reviewed  Level of consciousness: awake  Complications: No apparent anesthesia complications

## 2015-10-11 NOTE — Lactation Note (Signed)
This note was copied from the chart of Boy Clyda GreenerMelinda Mazzie. Lactation Consultation Note  Patient Name: Boy Clyda GreenerMelinda Montante ZOXWR'UToday's Date: 10/11/2015 Reason for consult: Initial assessment (mom in the bathroom , MBU RN plans on askin mom to call for feeding assessent )   Maternal Data    Feeding Feeding Type: Breast Fed  LATCH Score/Interventions Latch: Repeated attempts needed to sustain latch, nipple held in mouth throughout feeding, stimulation needed to elicit sucking reflex.  Audible Swallowing: A few with stimulation Intervention(s): Skin to skin;Hand expression  Type of Nipple: Flat Intervention(s): Shells  Comfort (Breast/Nipple): Soft / non-tender     Hold (Positioning): Assistance needed to correctly position infant at breast and maintain latch.  LATCH Score: 6  Lactation Tools Discussed/Used Tools: Nipple Dorris CarnesShields;Shells;Pump ([facility] ) Nipple shield size: 24 Shell Type: Inverted Breast pump type: Double-Electric Breast Pump   Consult Status Consult Status: Follow-up Date: 10/11/15 Follow-up type: In-patient    Kathrin Greathouseorio, Collins Dimaria Ann 10/11/2015, 5:22 PM

## 2015-10-12 LAB — BIRTH TISSUE RECOVERY COLLECTION (PLACENTA DONATION)

## 2015-10-12 NOTE — Progress Notes (Signed)
  Patient is eating, ambulating, voiding.  Pain control is good.  Filed Vitals:   10/11/15 1232 10/11/15 1700 10/11/15 2120 10/12/15 0630  BP: 108/55 114/69 107/61 119/53  Pulse: 86 80 83 92  Temp: 97.4 F (36.3 C) 97.8 F (36.6 C) 98.4 F (36.9 C)   TempSrc: Oral Oral Oral   Resp: 18 18 18 18   Height:      Weight:      SpO2: 100% 100% 96%     lungs:   clear to auscultation cor:    RRR Abdomen:  soft, appropriate tenderness, incisions intact and without erythema or exudate ex:    no cords   Lab Results  Component Value Date   WBC 17.1* 10/11/2015   HGB 9.6* 10/11/2015   HCT 28.7* 10/11/2015   MCV 78.6 10/11/2015   PLT 272 10/11/2015    --/--/O POS (10/17 1305)/RI  A/P    Post operative day 2.  Routine post op and postpartum care.  Expect d/c routine.  Percocet for pain control.

## 2015-10-12 NOTE — Progress Notes (Signed)
UR chart review completed.  

## 2015-10-13 MED ORDER — IBUPROFEN 600 MG PO TABS: 600.0000 mg | ORAL_TABLET | Freq: Four times a day (QID) | ORAL | Status: DC

## 2015-10-13 MED ORDER — OXYCODONE-ACETAMINOPHEN 5-325 MG PO TABS: 1.0000 | ORAL_TABLET | Freq: Four times a day (QID) | ORAL | Status: DC | PRN

## 2015-10-13 NOTE — Lactation Note (Signed)
This note was copied from the chart of Kristy Clyda GreenerMelinda Tucker. Lactation Consultation Note: Mother breastfeeding infant when I entered room.  Observed infant with strong tugging and audible swallows. Mother has been breastfeeding on cue. Mother states she has a small blister on the right nipple . Observed a tiny fluid filled blister on the tip of the right nipple . Mother advised to apply warm compress to nipple before breastfeeding. Mother has comfort gels . Reviewed treatment plan to prevent severe engorgement. Mother advised in cluster feeding and cue base feeding infant. Mother advised to follow up with Kristy Tucker services as needed.   Patient Name: Kristy Tucker RUEAV'WToday's Date: 10/13/2015 Reason for consult: Follow-up assessment   Maternal Data    Feeding Feeding Type: Breast Fed Length of feed: 15 min  LATCH Score/Interventions Latch: Grasps breast easily, tongue down, lips flanged, rhythmical sucking. Intervention(s): Adjust position;Breast compression  Audible Swallowing: Spontaneous and intermittent Intervention(s): Hand expression  Type of Nipple: Everted at rest and after stimulation Intervention(s): Hand pump  Comfort (Breast/Nipple): Filling, red/small blisters or bruises, mild/mod discomfort  Problem noted: Filling  Hold (Positioning): No assistance needed to correctly position infant at breast. Intervention(s): Support Pillows;Position options;Skin to skin  LATCH Score: 9  Lactation Tools Discussed/Used     Consult Status Consult Status: Complete    Michel BickersKendrick, Diann Bangerter McCoy 10/13/2015, 11:09 AM

## 2015-10-13 NOTE — Discharge Summary (Signed)
Obstetric Discharge Summary Reason for Admission: induction of labor GHTN Prenatal Procedures: none Intrapartum Procedures: cesarean: low cervical, transverse for malpresentation Postpartum Procedures: none Complications-Operative and Postpartum: none HEMOGLOBIN  Date Value Ref Range Status  10/11/2015 9.6* 12.0 - 15.0 g/dL Final  16/10/960402/05/2012 54.012.5 12.2 - 16.2 g/dL Final   HCT  Date Value Ref Range Status  10/11/2015 28.7* 36.0 - 46.0 % Final   HCT, POC  Date Value Ref Range Status  01/30/2012 40.4 37.7 - 47.9 % Final    Physical Exam:  General: alert, cooperative and appears stated age 13Lochia: appropriate Uterine Fundus: firm Incision: healing well, no significant drainage DVT Evaluation: No evidence of DVT seen on physical exam.  Discharge Diagnoses: Term Pregnancy-delivered  Discharge Information: Date: 10/13/2015 Activity: pelvic rest Diet: routine Medications: PNV, Ibuprofen and Percocet Condition: stable Instructions: refer to practice specific booklet Discharge to: home Follow-up Information    Follow up with Almon HerculesOSS,KENDRA H., MD In 4 weeks.   Specialty:  Obstetrics and Gynecology   Contact information:   [address] [place] [address] [phone]       Newborn Data: Live born female  Birth Weight: 6 lb 14.6 oz (3135 g) APGAR: 9, 9  Home with mother.  [facility] Center[facility] GEFFEL Deryl Ports 10/13/2015, 8:37 AM

## 2015-10-13 NOTE — Discharge Instructions (Signed)
You may wash incision with soap and water.   °Do not soak the incision for 2 weeks (no tub baths or swimming).   °Keep incision dry. You may need to keep a sanitary pad or panty liner between the incision and your clothing for comfort and to keep the incision dry.  If you note drainage, increased pain, or increased redness of the incision, then please notify your physician. ° °Pelvic rest x 6 weeks (no intercourse or tampons)  ° °No lifting over 10 lbs for 6 weeks.  ° °Do not drive until you are not taking narcotic pain medication AND you can comfortably slam on the brakes. ° °You have a mild anemia due to blood loss from delivery.  Please continue to take a prenatal vitamin daily °

## 2015-10-13 NOTE — Progress Notes (Signed)
  Patient is eating, ambulating, voiding.  Pain control is good.  Filed Vitals:   10/11/15 2120 10/12/15 0630 10/12/15 1846 10/13/15 0600  BP: 107/61 119/53 116/60 124/51  Pulse: 83 92 90 75  Temp: 98.4 F (36.9 C)  98.2 F (36.8 C) 98.3 F (36.8 C)  TempSrc: Oral  Oral   Resp: 18 18 18 16   Height:      Weight:      SpO2: 96%       NAD Abdomen:  soft, appropriate tenderness, incisions intact and without erythema or exudate ex:    1+ edema b/l, symmetric  Lab Results  Component Value Date   WBC 17.1* 10/11/2015   HGB 9.6* 10/11/2015   HCT 28.7* 10/11/2015   MCV 78.6 10/11/2015   PLT 272 10/11/2015    --/--/O POS (10/17 1305)/RI  A/P   POD#3 s/p 1 LTCS 2/2 malpresentation Meeting all goals. D/c to home

## 2015-10-17 ENCOUNTER — Inpatient Hospital Stay (HOSPITAL_COMMUNITY): Admission: RE | Admit: 2015-10-17 | Payer: Medicaid Other | Source: Ambulatory Visit

## 2015-12-06 ENCOUNTER — Ambulatory Visit (HOSPITAL_COMMUNITY)
Admission: RE | Admit: 2015-12-06 | Discharge: 2015-12-06 | Disposition: A | Discharge status: Home / Self Care | Payer: Medicaid Other | Source: Ambulatory Visit | Attending: Gynecology | Admitting: Gynecology

## 2015-12-06 NOTE — Lactation Note (Signed)
Lactation Consult  Mother's reason for visit:  History of mastitis, intermittent SN, decreased MS at times Visit Type:  OP Appointment Notes:  Kristy Tucker is here today for intermittently sore nipples and has noticed a decrease in her milk supply at times.  Baby is noted to have nasal congestion that mom reports he has had from birth.  He also chokes at times when he is feeding and has a history of reflux.  These can all be symptoms of ankyloglossia.   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  He latches to the breast and almost immediately jaw quivering is noted related to muscle fatigue.  His upper lip also is not flanging well.  Mom has an abundant supply at this point in time.  MER is more than likely contributing to intake. Today he transferred 5 oz.  I am somewhat concerned about mom's sore nipples and history of mastitis and suspect it is related to SN and broken skin.  Mom has been doing research on tongue restrictions (https://www.bennett.info/Dr.Ghaheri.com/downloads) and desires to have him evaluated by a professional who revises tongue restrictions.  She was given the names of Cooper RenderBrian McMurtry ,DDS, in [place] and Dr. Stevphen RochesterHansen DDS, in [place].  Both are well versed in revising tongue restrictions.  She was directed to www.http://www.taylor.net/feedthebabyllc.com to learn how to do sucking exercises and help with tongue mobilization.   Recommended tummy time to aid in increasing mobility in the upper body as this can have a positive impact on BF. Consult:  Initial Lactation Consultant:  Soyla DryerJoseph, Gunter Conde  ________________________________________________________________________  Joan FloresBaby's Name: Kristy Tucker Date of Birth: 10/10/2014 Pediatrician: Donnie Coffinubin Gender: female Gestational Age: 430w0d (At Birth) Birth Weight: 6 lb 14.6 oz (3135 g) Weight at Discharge: Weight: 6 lb 5.2 oz (2870 g)Date of Discharge: [facility]   10/10/15 2134 10/11/15 2351 10/12/15 2335  Weight: 6 lb 14.6 oz (3135 g)  6 lb 8.6 oz (2965 g) 6 lb 5.2 oz (2870 g)    Weight today: 5246 11#9 oz   Post feeding 5390  Total transfer 144 ml (5oz)   ________________________________________________________________________  Mother's Name: Kristy Tucker Type of delivery:   Breastfeeding Experience:  Over 1 year with first child Maternal Medical Conditions:NA Maternal Medications:  PNV  ________________________________________________________________________  Breastfeeding History (Post Discharge)  Frequency of breastfeeding:  On demand Duration of feeding:  10-15 minutes  Patient does not supplement or pump.  Infant Intake and Output Assessment  Voids:  6+ in 24 hrs.  Color:  Clear yellow Stools:  3+ in 24 hrs.  Color:  Yellow  ________________________________________________________________________  Maternal Breast Assessment  Breast:  Full Nipple:  Reddened Pain level:  0 no pain today Pain interventions:  Hydrogel dressing to aid in healing abraded areas  _______________________________________________________________________

## 2016-01-03 ENCOUNTER — Ambulatory Visit (HOSPITAL_COMMUNITY)
Admission: RE | Admit: 2016-01-03 | Discharge: 2016-01-03 | Disposition: A | Discharge status: Home / Self Care | Payer: Medicaid Other | Source: Ambulatory Visit | Attending: Obstetrics and Gynecology | Admitting: Obstetrics and Gynecology

## 2016-01-03 NOTE — Lactation Note (Signed)
Lactation Consult  Mother's reason for visit:  Follow-up post-revision Visit Type:  OP Appointment Notes:  INfant is 673 mos old and 2 weeks post revision.  Kristy Tucker was revised related to maternal pain that was continuing at 6 weeks.She reports no pain at this point.  He would not suck on a gloved finger but did attempt to lateralize.  Mom was given a resource to learn sucking exercises in an effort to help [name] lateralize better.  Mom also plans to take him to Kristy Tucker because she noticed that he prefers to look to the left side.  Kristy Tucker has been BF every 2-3 hours for 5-10 minutes and mom is concerned that this is not long enough.  Feeding was observed and though he did not have long jaw excursions he transferred a total of 5.8 oz from both breasts. Mom reports that she only offers him both breasts 2-3 times in 24 hours.  She was encouraged to offer both at each feeding because his weight was just below the lower limit for the past month.  Overall he is breastfeeding better per mother. Encouraged support group for a weight check next week.   Consult:  Initial Lactation Consultant:  Kristy DryerJoseph, Kristy Tucker  ________________________________________________________________________ Kristy FloresBaby's Name: Kristy Tucker Date of Birth: 10/10/2014 Pediatrician: Kristy Tucker Gender: female Gestational Age: 6185w0d (At Birth) Birth Weight: 6 lb 14.6 oz (3135 g) Weight at Discharge: Weight: 6 lb 5.2 oz (2870 g)Date of Dis[facility]: [facility]   10/10/15 2134 10/11/15 2351 10/12/15 2335  Weight: 6 lb 14.6 oz (3135 g) 6 lb 8.6 oz (2965 g) 6 lb 5.2 oz (2870 g)   [facility] 12 # 4 oz Location:Pediatrician's office Weight today: 13+7.6 oz      ________________________________________________________________________  Mother's Name: Kristy Tucker Type of delivery:  cesarean Breastfeeding Experience:  16 mos Maternal Medical  Conditions:  NA Maternal Medications:  PNV  ________________________________________________________________________   Voids:  6+ in 24 hrs.  Color:  Clear yellow Stools:  6+ in 24 hrs.  Color:  Yellow  ________________________________________________________________________

## 2016-08-10 ENCOUNTER — Other Ambulatory Visit: Payer: Self-pay | Admitting: Obstetrics and Gynecology

## 2016-08-13 LAB — CYTOLOGY - PAP

## 2016-09-05 ENCOUNTER — Telehealth (HOSPITAL_COMMUNITY): Payer: Self-pay | Admitting: Lactation Services

## 2016-09-05 NOTE — Telephone Encounter (Signed)
Patient is a breastfeeding mother of a 7253-month old. Infant has been latching well for a number of months, but recently he has begun changing his latch (possibly b/c he is teething & he is easily distracted) & now her nipples are blistered. Mom is contemplating quitting breastfeeding if there is no improvement.   LC appt made for tomorrow morning at 9am. Mom encouraged to pump in lieu of putting [name] to breast (if she needs to for comfort reasons). Mom only has a hand pump, so I also talked her through how to do hand expression in case she needs extra help expressing her milk.   Glenetta HewKim Erika Hussar, RN, IBCLC

## 2016-09-06 ENCOUNTER — Telehealth (HOSPITAL_COMMUNITY): Payer: Self-pay

## 2016-09-06 ENCOUNTER — Ambulatory Visit (HOSPITAL_COMMUNITY)
Admission: RE | Admit: 2016-09-06 | Discharge: 2016-09-06 | Disposition: A | Discharge status: Home / Self Care | Payer: Medicaid Other | Source: Ambulatory Visit | Attending: Obstetrics and Gynecology | Admitting: Obstetrics and Gynecology

## 2016-09-06 NOTE — Lactation Note (Signed)
Lactation Consult  Mother's reason for visit:  Mother has bilateral cracks and blisters. Mother  complains that Kristy Tucker bites and chews her nipple and she has scolded him for this and he now thinks its fun.  She was perscribed   APNO but had not been using it . She states that lanolin feel better. I advised to go back to [facility] due to likely to heal quicker. Suggested that mother breastfeed in soft light and away from loud noise.   nois  Consult:  Initial Lactation Consultant:  Kristy Tucker  ________________________________________________________________________    ________________________________________________________________________  Mother's Name: Kristy Tucker Type of delivery:   Breastfeeding Experience:  16 months Maternal Medical Conditions:  Pregnancy induced hypertension Maternal Medications: none  ________________________________________________________________________  Breastfeeding History (Post Discharge)  Frequency of breastfeeding:  2-3 times Duration of feeding: 10 mins  Patient does not supplement or pump.  Infant Intake and Output Assessment  Voids: 6  in 24 hrs.  Color:  Clear yellow Stools: 1-2  in 24 hrs.  Color:  Yellow  ________________________________________________________________________  Maternal Breast Assessment  Breast:  Full Nipple:  Erect Pain level:  0 Pain interventions:  Bra  _______________________________________________________________________   Initial feeding assessment: toddler latched on in a sitting up position on the left breast. He was moving constantly while stretching mothers nipple tissue the entire time. He transferred 38 ml in about 15-20 mins.   Infant's oral assessment:  WNL  Positioning:  Laid back Left breast  LATCH documentation:  Latch:  2 = Grasps breast easily, tongue down, lips flanged, rhythmical sucking.  Audible swallowing:  2 = Spontaneous and intermittent  Type of nipple:  2 = Everted  at rest and after stimulation  Comfort (Breast/Nipple):  1 = Filling, red/small blisters or bruises, mild/mod discomfort  Hold (Positioning):  2 = No assistance needed to correctly position infant at breast  LATCH score:  9   Attached assessment:  Deep  Lips flanged:  Yes.    Lips untucked:  Yes.    Suck assessment:  Displays both   Pre-feed weight: 8600,9-15.4 Post-feed weight: 8638, 19.00 Amount transferred: 38 ml   Additional Feeding Assessment - toddler placed in a semi cradle postion on the alternate breast. Kristy Tucker was still without moving while breastfeeding. He transferred 86 ml  Infant's oral assessment:  WNL  Positioning:  Cradle Right breast  LATCH documentation:  Latch:  2 = Grasps breast easily, tongue down, lips flanged, rhythmical sucking.  Audible swallowing:  2 = Spontaneous and intermittent  Type of nipple:  2 = Everted at rest and after stimulation  Comfort (Breast/Nipple):  1 = Filling, red/small blisters or bruises, mild/mod discomfort  Hold (Positioning):  2 = No assistance needed to correctly position infant at breast  LATCH score:  9   Attached assessment:  Shallow  Lips flanged:  Yes.    Lips untucked:  Yes.    Suck assessment:  Displays both      Pre-feed [id number]  Post-feed [id number]  Amount transferred: 86 ml  Total amount transferred: 124 ml  Mother advised to continue to breast feed but use options that were suggested. Chang positions, moving to dark room, making sure that room is quiet.  Mother to use APNO to heal nipples. I reviewed S/S of Mastitis. Mother has had history of Mastitis 2 times Advised to pump to take a break from feeding. Mother has a hand pump but will look into [facility] for a pump.  If continued to have cracking ,  blisters I suggested to see Dr Naida Sleightavid Spencer for treatment .

## 2017-04-17 ENCOUNTER — Other Ambulatory Visit: Payer: Self-pay | Admitting: Obstetrics and Gynecology

## 2017-05-07 LAB — OB RESULTS CONSOLE GC/CHLAMYDIA
Chlamydia: NEGATIVE
Gonorrhea: NEGATIVE

## 2017-05-07 LAB — OB RESULTS CONSOLE ANTIBODY SCREEN: ANTIBODY SCREEN: NEGATIVE

## 2017-05-07 LAB — OB RESULTS CONSOLE HIV ANTIBODY (ROUTINE TESTING): HIV: NONREACTIVE

## 2017-05-07 LAB — OB RESULTS CONSOLE HEPATITIS B SURFACE ANTIGEN: HEP B S AG: NEGATIVE

## 2017-05-07 LAB — OB RESULTS CONSOLE RPR: RPR: NONREACTIVE

## 2017-05-07 LAB — OB RESULTS CONSOLE ABO/RH: RH TYPE: POSITIVE

## 2017-05-07 LAB — OB RESULTS CONSOLE RUBELLA ANTIBODY, IGM: Rubella: IMMUNE

## 2017-09-05 ENCOUNTER — Other Ambulatory Visit: Payer: Self-pay | Admitting: Obstetrics and Gynecology

## 2017-10-24 ENCOUNTER — Other Ambulatory Visit: Payer: Self-pay | Admitting: Obstetrics and Gynecology

## 2017-10-30 ENCOUNTER — Encounter (HOSPITAL_COMMUNITY): Payer: Self-pay

## 2017-11-08 ENCOUNTER — Encounter (HOSPITAL_COMMUNITY)
Admission: RE | Admit: 2017-11-08 | Discharge: 2017-11-08 | Disposition: A | Discharge status: Home / Self Care | Payer: Medicaid Other | Source: Ambulatory Visit | Attending: Obstetrics and Gynecology | Admitting: Obstetrics and Gynecology

## 2017-11-08 DIAGNOSIS — J45909 Unspecified asthma, uncomplicated: Secondary | ICD-10-CM | POA: Diagnosis not present

## 2017-11-08 DIAGNOSIS — O368130 Decreased fetal movements, third trimester, not applicable or unspecified: Secondary | ICD-10-CM | POA: Diagnosis not present

## 2017-11-08 DIAGNOSIS — O99513 Diseases of the respiratory system complicating pregnancy, third trimester: Secondary | ICD-10-CM | POA: Diagnosis not present

## 2017-11-08 DIAGNOSIS — Z3A36 36 weeks gestation of pregnancy: Secondary | ICD-10-CM | POA: Diagnosis not present

## 2017-11-08 LAB — CBC
HCT: 34 % — ABNORMAL LOW (ref 36.0–46.0)
HEMOGLOBIN: 10.9 g/dL — AB (ref 12.0–15.0)
MCH: 25.7 pg — AB (ref 26.0–34.0)
MCHC: 32.1 g/dL (ref 30.0–36.0)
MCV: 80.2 fL (ref 78.0–100.0)
Platelets: 267 10*3/uL (ref 150–400)
RBC: 4.24 MIL/uL (ref 3.87–5.11)
RDW: 14.6 % (ref 11.5–15.5)
WBC: 9.7 10*3/uL (ref 4.0–10.5)

## 2017-11-08 LAB — TYPE AND SCREEN
ABO/RH(D): O POS
Antibody Screen: NEGATIVE

## 2017-11-08 NOTE — Patient Instructions (Signed)
Kristy Tucker  11/08/2017   Your procedure is scheduled on:  11/11/2017  [facility] at 1000 AM.  Pick up the phone at the desk and dial [phone]  Call this number if you have problems the morning of [phone]  Remember:   Do not eat food:After Midnight.  Do not drink clear liquids: After Midnight.  Take these medicines the morning of surgery with A SIP OF WATER: bring your inhaler   Do not wear jewelry, make-up or nail polish.  Do not wear lotions, powders, or perfumes. Do not wear deodorant.  Do not shave 48 hours prior to surgery.  Do not bring valuables to the hospital.  [facility] is not   responsible for any belongings or valuables brought to the hospital.  Contacts, dentures or bridgework may not be worn into surgery.  Leave suitcase in the car. After surgery it may be brought to your room.  For patients admitted to the hospital, checkout time is 11:00 AM the day of              discharge.    N/A   Please read over the following fact sheets that you were given:   Surgical Site Infection Prevention

## 2017-11-09 ENCOUNTER — Other Ambulatory Visit: Payer: Self-pay

## 2017-11-09 ENCOUNTER — Encounter (HOSPITAL_COMMUNITY): Payer: Self-pay | Admitting: *Deleted

## 2017-11-09 ENCOUNTER — Inpatient Hospital Stay (HOSPITAL_COMMUNITY)
Admission: AD | Admit: 2017-11-09 | Discharge: 2017-11-09 | Disposition: A | Discharge status: Home / Self Care | Payer: Medicaid Other | Source: Ambulatory Visit | Attending: Obstetrics and Gynecology | Admitting: Obstetrics and Gynecology

## 2017-11-09 DIAGNOSIS — Z3A36 36 weeks gestation of pregnancy: Secondary | ICD-10-CM | POA: Diagnosis not present

## 2017-11-09 DIAGNOSIS — Z3689 Encounter for other specified antenatal screening: Secondary | ICD-10-CM

## 2017-11-09 DIAGNOSIS — J45909 Unspecified asthma, uncomplicated: Secondary | ICD-10-CM | POA: Insufficient documentation

## 2017-11-09 DIAGNOSIS — O99513 Diseases of the respiratory system complicating pregnancy, third trimester: Secondary | ICD-10-CM | POA: Insufficient documentation

## 2017-11-09 DIAGNOSIS — O368130 Decreased fetal movements, third trimester, not applicable or unspecified: Secondary | ICD-10-CM | POA: Diagnosis not present

## 2017-11-09 LAB — RPR: RPR Ser Ql: NONREACTIVE

## 2017-11-09 NOTE — MAU Provider Note (Signed)
  History     CSN: [id number]  Arrival date and time: 11/09/17 [id number]   First Provider Initiated Contact with Patient 11/09/17 0715      Chief Complaint  Patient presents with  . Decreased Fetal Movement   HPI Patient Kristy Tucker is a 31 y.o. [id number] At 6483w6d here with concerns about not feeling her baby move during the night. Patient's history is significant for IUFD at 41 weeks in 2015. She is scheduled a repeat c-section on Monday, November 19.   Patient states she was up with her children during the night (who are 2 and 5). While caring for them, she felt like the baby didn't move. She waited an hour and didn't feel any movement so she decided to come in.   She denies bleeding, leaking of fluid, or other ob-gyn complaints.   OB History    Gravida Para Term Preterm AB Living   6 3 3   2 2    SAB TAB Ectopic Multiple Live Births   1 1   0 2      Past Medical History:  Diagnosis Date  . Asthma     Past Surgical History:  Procedure Laterality Date  . BARTHOLIN GLAND CYST EXCISION    . CESAREAN SECTION N/A 10/10/2015   [facility] Waynard Reedsoss[facility] Kendra[facility]    Family History  Problem Relation Age of Onset  . Hearing loss Mother   . Asthma Brother   . Diabetes Maternal Uncle     Social History   Tobacco Use  . Smoking status: Never Smoker  . Smokeless tobacco: Never Used  Substance Use Topics  . Alcohol use: No  . Drug use: No    Allergies:  Allergies  Allergen Reactions  . Latex Rash and Other (See Comments)    Rash if wearing gloves    No medications prior to admission.    Review of Systems  All other systems reviewed and are negative.  Physical Exam   Blood pressure 133/70, pulse 77, temperature 97.8 F (36.6 C), resp. rate 18, height 5\' 8"  (1.727 m), weight 269 lb (122 kg), last menstrual period 02/24/2017, unknown if currently breastfeeding.  Physical Exam  Constitutional: She is oriented to person, place, and time. She appears  well-developed.  HENT:  Head: Normocephalic.  Neck: Normal range of motion.  Respiratory: Effort normal.  GI: Soft.  Musculoskeletal: Normal range of motion.  Neurological: She is alert and oriented to person, place, and time.  Skin: Skin is warm and dry.  Psychiatric: She has a normal mood and affect.    MAU Course  Procedures  MDM NST: 120 bpm, mod variability, present acel, no decels, occasional contraction.  Plan of care reviewed with Dr. Dareen PianoAnderson, who agrees.   Assessment and Plan   1. NST (non-stress test) reactive    2. Discussed kick counts; Dr. Dareen PianoAnderson recommends to do kick counts twice a day.  3. Reviewed warning signs and when to return to MAU; reassurance and support given.  4. Patient verbalized understanding; patient is stable for discharge with strict return precautions and kick count information.   Charlesetta GaribaldiKathryn Lorraine Guida Asman 11/09/2017, 11:12 AM

## 2017-11-09 NOTE — MAU Note (Addendum)
Got up at 0400 and noticed did not feel at least 10movements in an hour. Hx stillbirth at 3842wks. For repeat c/s on Monday 11/11/17. Denies LOF or bleeding. AT 2100 last night was getting up and felt "like muscle stretching" left side of abdomen and felt stretching sensation for couple of hours and then went to sleep

## 2017-11-09 NOTE — Progress Notes (Signed)
Baby active and pt feels FM 

## 2017-11-09 NOTE — Progress Notes (Signed)
Alverda SkeansKatherine Kooistra CNM on unit and aware of pt's admission and status. FM strip reviewed. Will cont to observe

## 2017-11-09 NOTE — Progress Notes (Signed)
Kristy SkeansKatherine Kooistra CNM in to discuss dc plan with pt. Written and verbal d/c instructions given and understanding voiced. To return Monday 1000 for repeat c/s or sooner for any concerns. Fetal Kick count discussed

## 2017-11-09 NOTE — Discharge Instructions (Signed)

## 2017-11-11 ENCOUNTER — Inpatient Hospital Stay (HOSPITAL_COMMUNITY)
Admission: RE | Admit: 2017-11-11 | Discharge: 2017-11-14 | DRG: 788 | Disposition: A | Discharge status: Home / Self Care | Payer: Medicaid Other | Source: Ambulatory Visit | Attending: Obstetrics and Gynecology | Admitting: Obstetrics and Gynecology

## 2017-11-11 ENCOUNTER — Encounter (HOSPITAL_COMMUNITY): Payer: Self-pay | Admitting: *Deleted

## 2017-11-11 ENCOUNTER — Encounter (HOSPITAL_COMMUNITY)
Admission: RE | Disposition: A | Discharge status: Home / Self Care | Payer: Self-pay | Source: Ambulatory Visit | Attending: Obstetrics and Gynecology

## 2017-11-11 ENCOUNTER — Inpatient Hospital Stay (HOSPITAL_COMMUNITY): Payer: Medicaid Other | Admitting: Anesthesiology

## 2017-11-11 ENCOUNTER — Other Ambulatory Visit: Payer: Self-pay

## 2017-11-11 DIAGNOSIS — O34212 Maternal care for vertical scar from previous cesarean delivery: Principal | ICD-10-CM | POA: Diagnosis present

## 2017-11-11 DIAGNOSIS — O328XX0 Maternal care for other malpresentation of fetus, not applicable or unspecified: Secondary | ICD-10-CM | POA: Diagnosis present

## 2017-11-11 DIAGNOSIS — O99214 Obesity complicating childbirth: Secondary | ICD-10-CM | POA: Diagnosis present

## 2017-11-11 DIAGNOSIS — Z3A37 37 weeks gestation of pregnancy: Secondary | ICD-10-CM

## 2017-11-11 SURGERY — Surgical Case
Anesthesia: Spinal

## 2017-11-11 MED ORDER — OXYTOCIN 40 UNITS IN LACTATED RINGERS INFUSION - SIMPLE MED: 2.5000 [IU]/h | INTRAVENOUS | Status: AC

## 2017-11-11 MED ORDER — MENTHOL 3 MG MT LOZG: 1.0000 | LOZENGE | OROMUCOSAL | Status: DC | PRN

## 2017-11-11 MED ORDER — PHENYLEPHRINE 40 MCG/ML (10ML) SYRINGE FOR IV PUSH (FOR BLOOD PRESSURE SUPPORT)
PREFILLED_SYRINGE | INTRAVENOUS | Status: AC
  Filled 2017-11-11: qty 10

## 2017-11-11 MED ORDER — WITCH HAZEL-GLYCERIN EX PADS: 1.0000 "application " | MEDICATED_PAD | CUTANEOUS | Status: DC | PRN

## 2017-11-11 MED ORDER — CALCIUM CARBONATE ANTACID 500 MG PO CHEW
1.0000 | CHEWABLE_TABLET | ORAL | Status: DC | PRN
  Administered 2017-11-11: 400 mg via ORAL
  Administered 2017-11-13 (×2): 200 mg via ORAL
  Filled 2017-11-11: qty 2
  Filled 2017-11-11 (×2): qty 1

## 2017-11-11 MED ORDER — DIPHENHYDRAMINE HCL 50 MG/ML IJ SOLN
12.5000 mg | INTRAMUSCULAR | Status: DC | PRN
  Administered 2017-11-12: 12.5 mg via INTRAVENOUS
  Filled 2017-11-11: qty 1

## 2017-11-11 MED ORDER — IBUPROFEN 600 MG PO TABS
600.0000 mg | ORAL_TABLET | Freq: Four times a day (QID) | ORAL | Status: DC
  Administered 2017-11-11 – 2017-11-14 (×11): 600 mg via ORAL
  Filled 2017-11-11 (×12): qty 1

## 2017-11-11 MED ORDER — TETANUS-DIPHTH-ACELL PERTUSSIS 5-2.5-18.5 LF-MCG/0.5 IM SUSP: 0.5000 mL | Freq: Once | INTRAMUSCULAR | Status: DC

## 2017-11-11 MED ORDER — FENTANYL CITRATE (PF) 100 MCG/2ML IJ SOLN
INTRAMUSCULAR | Status: DC | PRN
  Administered 2017-11-11: 10 ug via INTRATHECAL

## 2017-11-11 MED ORDER — SCOPOLAMINE 1 MG/3DAYS TD PT72
MEDICATED_PATCH | TRANSDERMAL | Status: DC | PRN
  Administered 2017-11-11: 1 via TRANSDERMAL

## 2017-11-11 MED ORDER — MORPHINE SULFATE (PF) 0.5 MG/ML IJ SOLN
INTRAMUSCULAR | Status: AC
  Filled 2017-11-11: qty 10

## 2017-11-11 MED ORDER — SIMETHICONE 80 MG PO CHEW
80.0000 mg | CHEWABLE_TABLET | Freq: Three times a day (TID) | ORAL | Status: DC
  Administered 2017-11-11 – 2017-11-14 (×9): 80 mg via ORAL
  Filled 2017-11-11 (×7): qty 1

## 2017-11-11 MED ORDER — DIPHENHYDRAMINE HCL 25 MG PO CAPS: 25.0000 mg | ORAL_CAPSULE | Freq: Four times a day (QID) | ORAL | Status: DC | PRN

## 2017-11-11 MED ORDER — PHENYLEPHRINE 8 MG IN D5W 100 ML (0.08MG/ML) PREMIX OPTIME
INJECTION | INTRAVENOUS | Status: DC | PRN
  Administered 2017-11-11: 60 ug/min via INTRAVENOUS

## 2017-11-11 MED ORDER — NALBUPHINE HCL 10 MG/ML IJ SOLN: 5.0000 mg | Freq: Once | INTRAMUSCULAR | Status: DC | PRN

## 2017-11-11 MED ORDER — OXYTOCIN 10 UNIT/ML IJ SOLN
INTRAMUSCULAR | Status: AC
  Filled 2017-11-11: qty 4

## 2017-11-11 MED ORDER — FENTANYL CITRATE (PF) 100 MCG/2ML IJ SOLN
INTRAMUSCULAR | Status: AC
  Filled 2017-11-11: qty 2

## 2017-11-11 MED ORDER — SIMETHICONE 80 MG PO CHEW
80.0000 mg | CHEWABLE_TABLET | ORAL | Status: DC
  Administered 2017-11-13 (×2): 80 mg via ORAL
  Filled 2017-11-11 (×3): qty 1

## 2017-11-11 MED ORDER — SENNOSIDES-DOCUSATE SODIUM 8.6-50 MG PO TABS
2.0000 | ORAL_TABLET | ORAL | Status: DC
  Administered 2017-11-11 – 2017-11-13 (×3): 2 via ORAL
  Filled 2017-11-11 (×3): qty 2

## 2017-11-11 MED ORDER — LACTATED RINGERS IV SOLN
INTRAVENOUS | Status: DC
  Administered 2017-11-12: 02:00:00 via INTRAVENOUS

## 2017-11-11 MED ORDER — DEXTROSE 5 % IV SOLN
3.0000 g | INTRAVENOUS | Status: AC
  Administered 2017-11-11: 3 g via INTRAVENOUS
  Filled 2017-11-11: qty 3000

## 2017-11-11 MED ORDER — LACTATED RINGERS IV SOLN
INTRAVENOUS | Status: DC
  Administered 2017-11-11 (×2): via INTRAVENOUS

## 2017-11-11 MED ORDER — ONDANSETRON HCL 4 MG/2ML IJ SOLN
INTRAMUSCULAR | Status: DC | PRN
  Administered 2017-11-11: 4 mg via INTRAVENOUS

## 2017-11-11 MED ORDER — SODIUM CHLORIDE 0.9% FLUSH: 3.0000 mL | INTRAVENOUS | Status: DC | PRN

## 2017-11-11 MED ORDER — NALOXONE HCL 0.4 MG/ML IJ SOLN: 0.4000 mg | INTRAMUSCULAR | Status: DC | PRN

## 2017-11-11 MED ORDER — METHYLERGONOVINE MALEATE 0.2 MG PO TABS: 0.2000 mg | ORAL_TABLET | ORAL | Status: DC | PRN

## 2017-11-11 MED ORDER — PRENATAL MULTIVITAMIN CH
1.0000 | ORAL_TABLET | Freq: Every day | ORAL | Status: DC
  Administered 2017-11-12 – 2017-11-14 (×3): 1 via ORAL
  Filled 2017-11-11 (×3): qty 1

## 2017-11-11 MED ORDER — ACETAMINOPHEN 325 MG PO TABS
650.0000 mg | ORAL_TABLET | ORAL | Status: DC | PRN
  Administered 2017-11-11: 650 mg via ORAL
  Filled 2017-11-11: qty 2

## 2017-11-11 MED ORDER — LACTATED RINGERS IV SOLN: INTRAVENOUS | Status: DC

## 2017-11-11 MED ORDER — SIMETHICONE 80 MG PO CHEW
80.0000 mg | CHEWABLE_TABLET | ORAL | Status: DC | PRN
  Filled 2017-11-11: qty 1

## 2017-11-11 MED ORDER — METHYLERGONOVINE MALEATE 0.2 MG/ML IJ SOLN: 0.2000 mg | INTRAMUSCULAR | Status: DC | PRN

## 2017-11-11 MED ORDER — ZOLPIDEM TARTRATE 5 MG PO TABS
5.0000 mg | ORAL_TABLET | Freq: Every evening | ORAL | Status: DC | PRN
Start: 2017-11-11 — End: 2017-11-14

## 2017-11-11 MED ORDER — ONDANSETRON HCL 4 MG/2ML IJ SOLN
INTRAMUSCULAR | Status: AC
  Filled 2017-11-11: qty 2

## 2017-11-11 MED ORDER — DIPHENHYDRAMINE HCL 25 MG PO CAPS: 25.0000 mg | ORAL_CAPSULE | ORAL | Status: DC | PRN

## 2017-11-11 MED ORDER — KETOROLAC TROMETHAMINE 30 MG/ML IJ SOLN
30.0000 mg | Freq: Once | INTRAMUSCULAR | Status: AC
  Administered 2017-11-11: 30 mg via INTRAVENOUS
  Filled 2017-11-11: qty 1

## 2017-11-11 MED ORDER — MEPERIDINE HCL 25 MG/ML IJ SOLN: 6.2500 mg | INTRAMUSCULAR | Status: DC | PRN

## 2017-11-11 MED ORDER — OXYTOCIN 10 UNIT/ML IJ SOLN
INTRAVENOUS | Status: DC | PRN
  Administered 2017-11-11: 40 [IU] via INTRAVENOUS

## 2017-11-11 MED ORDER — GLYCOPYRROLATE 0.2 MG/ML IJ SOLN
INTRAMUSCULAR | Status: AC
  Filled 2017-11-11: qty 1

## 2017-11-11 MED ORDER — DIBUCAINE 1 % RE OINT
1.0000 | TOPICAL_OINTMENT | RECTAL | Status: DC | PRN
Start: 2017-11-11 — End: 2017-11-14

## 2017-11-11 MED ORDER — ONDANSETRON HCL 4 MG/2ML IJ SOLN: 4.0000 mg | Freq: Three times a day (TID) | INTRAMUSCULAR | Status: DC | PRN

## 2017-11-11 MED ORDER — OXYCODONE-ACETAMINOPHEN 5-325 MG PO TABS
1.0000 | ORAL_TABLET | ORAL | Status: DC | PRN
  Administered 2017-11-12 – 2017-11-14 (×6): 1 via ORAL
  Filled 2017-11-11 (×6): qty 1

## 2017-11-11 MED ORDER — COCONUT OIL OIL
1.0000 "application " | TOPICAL_OIL | Status: DC | PRN
  Administered 2017-11-13: 1 via TOPICAL
  Filled 2017-11-11: qty 120

## 2017-11-11 MED ORDER — NALBUPHINE HCL 10 MG/ML IJ SOLN: 5.0000 mg | INTRAMUSCULAR | Status: DC | PRN

## 2017-11-11 MED ORDER — PROMETHAZINE HCL 25 MG/ML IJ SOLN: 6.2500 mg | INTRAMUSCULAR | Status: DC | PRN

## 2017-11-11 MED ORDER — OXYCODONE-ACETAMINOPHEN 5-325 MG PO TABS: 2.0000 | ORAL_TABLET | ORAL | Status: DC | PRN

## 2017-11-11 MED ORDER — DEXAMETHASONE SODIUM PHOSPHATE 4 MG/ML IJ SOLN
INTRAMUSCULAR | Status: AC
  Filled 2017-11-11: qty 1

## 2017-11-11 MED ORDER — SCOPOLAMINE 1 MG/3DAYS TD PT72: 1.0000 | MEDICATED_PATCH | Freq: Once | TRANSDERMAL | Status: DC

## 2017-11-11 MED ORDER — LACTATED RINGERS IV SOLN
INTRAVENOUS | Status: DC | PRN
  Administered 2017-11-11: 12:00:00 via INTRAVENOUS

## 2017-11-11 MED ORDER — DEXAMETHASONE SODIUM PHOSPHATE 4 MG/ML IJ SOLN
INTRAMUSCULAR | Status: DC | PRN
  Administered 2017-11-11: 4 mg via INTRAVENOUS

## 2017-11-11 MED ORDER — HYDROMORPHONE HCL 1 MG/ML IJ SOLN: 0.2500 mg | INTRAMUSCULAR | Status: DC | PRN

## 2017-11-11 MED ORDER — MORPHINE SULFATE (PF) 0.5 MG/ML IJ SOLN
INTRAMUSCULAR | Status: DC | PRN
  Administered 2017-11-11: 200 ug via INTRATHECAL

## 2017-11-11 MED ORDER — NALOXONE HCL 0.4 MG/ML IJ SOLN: 1.0000 ug/kg/h | INTRAVENOUS | Status: DC | PRN

## 2017-11-11 MED ORDER — BUPIVACAINE IN DEXTROSE 0.75-8.25 % IT SOLN
INTRATHECAL | Status: DC | PRN
  Administered 2017-11-11: 1.6 mL via INTRATHECAL

## 2017-11-11 MED ORDER — SCOPOLAMINE 1 MG/3DAYS TD PT72
MEDICATED_PATCH | TRANSDERMAL | Status: AC
  Filled 2017-11-11: qty 1

## 2017-11-11 MED ORDER — PHENYLEPHRINE 8 MG IN D5W 100 ML (0.08MG/ML) PREMIX OPTIME
INJECTION | INTRAVENOUS | Status: AC
  Filled 2017-11-11: qty 100

## 2017-11-11 SURGICAL SUPPLY — 33 items
CHLORAPREP W/TINT 26ML (MISCELLANEOUS) ×3 IMPLANT
CLAMP CORD UMBIL (MISCELLANEOUS) IMPLANT
CLOTH BEACON ORANGE TIMEOUT ST (SAFETY) ×3 IMPLANT
DERMABOND ADHESIVE PROPEN (GAUZE/BANDAGES/DRESSINGS) ×2
DERMABOND ADVANCED (GAUZE/BANDAGES/DRESSINGS)
DERMABOND ADVANCED .7 DNX12 (GAUZE/BANDAGES/DRESSINGS) IMPLANT
DERMABOND ADVANCED .7 DNX6 (GAUZE/BANDAGES/DRESSINGS) ×1 IMPLANT
DRSG OPSITE POSTOP 4X10 (GAUZE/BANDAGES/DRESSINGS) ×3 IMPLANT
ELECT REM PT RETURN 9FT ADLT (ELECTROSURGICAL) ×3
ELECTRODE REM PT RTRN 9FT ADLT (ELECTROSURGICAL) ×1 IMPLANT
EXTRACTOR VACUUM M CUP 4 TUBE (SUCTIONS) IMPLANT
EXTRACTOR VACUUM M CUP 4' TUBE (SUCTIONS)
GLOVE BIO SURGEON STRL SZ7 (GLOVE) ×3 IMPLANT
GLOVE BIOGEL PI IND STRL 7.0 (GLOVE) ×1 IMPLANT
GLOVE BIOGEL PI INDICATOR 7.0 (GLOVE) ×2
GOWN STRL REUS W/TWL LRG LVL3 (GOWN DISPOSABLE) ×6 IMPLANT
KIT ABG SYR 3ML LUER SLIP (SYRINGE) IMPLANT
NEEDLE HYPO 22GX1.5 SAFETY (NEEDLE) IMPLANT
NEEDLE HYPO 25X5/8 SAFETYGLIDE (NEEDLE) IMPLANT
NS IRRIG 1000ML POUR BTL (IV SOLUTION) ×3 IMPLANT
PACK C SECTION WH (CUSTOM PROCEDURE TRAY) ×3 IMPLANT
PAD OB MATERNITY 4.3X12.25 (PERSONAL CARE ITEMS) ×3 IMPLANT
PENCIL SMOKE EVAC W/HOLSTER (ELECTROSURGICAL) ×3 IMPLANT
RTRCTR C-SECT PINK 25CM LRG (MISCELLANEOUS) ×3 IMPLANT
SUT CHROMIC 1 CTX 36 (SUTURE) ×6 IMPLANT
SUT CHROMIC 2 0 CT 1 (SUTURE) ×3 IMPLANT
SUT PDS AB 0 CTX 60 (SUTURE) ×3 IMPLANT
SUT VIC AB 2-0 CT1 27 (SUTURE) ×2
SUT VIC AB 2-0 CT1 TAPERPNT 27 (SUTURE) ×1 IMPLANT
SUT VIC AB 4-0 KS 27 (SUTURE) IMPLANT
SYR 30ML LL (SYRINGE) IMPLANT
TOWEL OR 17X24 6PK STRL BLUE (TOWEL DISPOSABLE) ×3 IMPLANT
TRAY FOLEY BAG SILVER LF 14FR (SET/KITS/TRAYS/PACK) ×3 IMPLANT

## 2017-11-11 NOTE — Anesthesia Procedure Notes (Signed)
Spinal  Patient location during procedure: OR Start time: 11/11/2017 11:58 AM End time: 11/11/2017 12:01 PM Staffing Anesthesiologist: Heather RobertsSinger, James, MD Resident/CRNA: Cleda ClarksBrowder, Robyn R, CRNA Performed: other anesthesia staff  Preanesthetic Checklist Completed: patient identified, surgical consent, pre-op evaluation, timeout performed, IV checked, risks and benefits discussed and monitors and equipment checked Spinal Block Patient position: sitting Prep: DuraPrep Patient monitoring: heart rate, continuous pulse ox and blood pressure Approach: midline Location: L3-4 Injection technique: single-shot Needle Needle type: Pencan  Needle gauge: 24 G Needle length: 10 cm Assessment Sensory level: T4 Additional Notes Performed by B. Blake DivineLackey SRNA

## 2017-11-11 NOTE — Op Note (Signed)
Pre-Operative Diagnosis: 1) 37+1-week intrauterine pregnancy 2) history of prior classical cesarean section, not a candidate for a trial of labor Postoperative Diagnosis:  1) 37+1-week intrauterine pregnancy 2) history of prior classical cesarean section, not a candidate for a trial of labor 3) double footling breech presentation Procedure: Repeat cesarean section Surgeon: Dr. Waynard ReedsKendra Kanon Novosel Assistant: Carmel Sacramentor.Dyanna Clark Operative Findings: Female infant in the double footling breech presentation with Apgar scores of 8 at 1 minute and 9 at 5 minutes.  The prior classical cesarean section scar was noted on the uterus.  Adnexa normal appearing bilaterally.  Initially, the baby had decreased tone and the decision was made to clamp and cut the cord prior to the 1 minute delay.  Specimen: Placenta to L&D EBL: Total I/O In: 2400 [I.V.:2400] Out: 710 [Urine:200; Blood:510]    Procedure:[name] is an 31 year old gravida 6 para 3322 at 5737 weeks and 1 days estimated gestational age who presents for cesarean section. Following the appropriate informed consent the patient was brought to the operating room where spinal anesthesia was administered and found to be adequate. She was placed in the dorsal supine position with a leftward tilt. She was prepped and draped in the normal sterile fashion.  The patient was appropriately identified and a preoperative timeout procedure . The scalpel was then used to make a Pfannenstiel skin incision which was carried down to the underlying layers of soft tissue to the fascia. The fascia was incised in the midline and the fascial incision was extended laterally with Mayo scissors. The superior aspect of the fascial incision was grasped with Coker clamps x2 Tented up and the rectus muscles dissected off sharply with the electrocautery unit area and the same procedure was repeated on the inferior aspect of the fascial incision. The rectus muscles were separated in the midline. The  abdominal peritoneum was identified, tented up, entered sharply, and the incision was extended superiorly and inferiorly with good visualization of the bladder. The a Alexis retractor was then deployed. The vesicouterine peritoneum was identified, tented up, entered sharply, and the bladder flap was created digitally. Scalpel was then used to make a low transverse incision on the uterus which was extended laterally with both blunt dissection and the bandage scissors. The fetal feet were identified, delivered easily through the uterine incision followed by the head. The infant was bulb suctioned on the operative field. Initially, the infant appeared stunned and had decreased tone and the decision was made to clamp and cut the cord prior to the 1 minute delay.  With passing of the infant to the waiting RN to bring the baby to the NICU team the baby began vigorously crying. The placenta was then delivered spontaneously, the uterus was cleared of all clot and debris. The uterine incision was repaired with #1 chromic in running locked fashion. Ovaries and tubes were inspected and normal. The Alexis retractor was removed. The uterus was returned to the abdominal cavity the abdominal cavity was cleared of all clot and debris. The abdominal peritoneum was reapproximated with 2-0 Vicryl in a running fashion, the rectus muscles was reapproximated with 2-0 chromic in a running fashion. The fascia was closed with a looped PDS in a running fashion. The subcutaneous layer was reapproximated with 2-0 plain gut with interrupted suture.  The skin was closed with 4-0 Vicryl in a subcuticular fashion and Dermabond.  Honeycomb surgical dressing was placed.  All sponge, lap, needle counts were correct.  This completed the operative procedure the patient was transferred  to the PACU in stable condition following the procedure    The skin was closed with s All sponge lap and needle counts were correct x2. Patient tolerated the  procedure well and recovered in stable condition following the procedure.

## 2017-11-11 NOTE — Anesthesia Postprocedure Evaluation (Signed)
Anesthesia Post Note  Patient: Kristy GreenerMelinda Tucker  Procedure(s) Performed: CESAREAN SECTION (N/A )     Patient location during evaluation: PACU Anesthesia Type: Spinal Level of consciousness: awake and alert Pain management: pain level controlled Vital Signs Assessment: post-procedure vital signs reviewed and stable Respiratory status: spontaneous breathing and respiratory function stable Cardiovascular status: blood pressure returned to baseline and stable Postop Assessment: spinal receding Anesthetic complications: no    Last Vitals:  Vitals:   11/11/17 1400 11/11/17 1440  BP: 114/67 124/76  Pulse: 75 74  Resp: (!) 23 18  Temp:  36.4 C  SpO2: 100%     Last Pain:  Vitals:   11/11/17 1440  TempSrc: Oral   Pain Goal:                 [name] DANIEL

## 2017-11-11 NOTE — Anesthesia Preprocedure Evaluation (Addendum)
Anesthesia Evaluation  Patient identified by MRN, date of birth, ID band Patient awake    Reviewed: Allergy & Precautions, NPO status , Patient's Chart, lab work & pertinent test results  History of Anesthesia Complications Negative for: history of anesthetic complications  Airway Mallampati: II  TM Distance: >3 FB Neck ROM: Full    Dental no notable dental hx. (+) Dental Advisory Given   Pulmonary asthma ,    Pulmonary exam normal        Cardiovascular hypertension, Normal cardiovascular exam     Neuro/Psych negative neurological ROS  negative psych ROS   GI/Hepatic negative GI ROS, Neg liver ROS,   Endo/Other  Morbid obesity  Renal/GU negative Renal ROS  negative genitourinary   Musculoskeletal negative musculoskeletal ROS (+)   Abdominal   Peds negative pediatric ROS (+)  Hematology negative hematology ROS (+)   Anesthesia Other Findings   Reproductive/Obstetrics (+) Pregnancy                            Anesthesia Physical  Anesthesia Plan  ASA: III  Anesthesia Plan: Spinal   Post-op Pain Management:    Induction:   PONV Risk Score and Plan: 3 and Ondansetron, Dexamethasone and Scopolamine patch - Pre-op  Airway Management Planned: Natural Airway  Additional Equipment:   Intra-op Plan:   Post-operative Plan:   Informed Consent: I have reviewed the patients History and Physical, chart, labs and discussed the procedure including the risks, benefits and alternatives for the proposed anesthesia with the patient or authorized representative who has indicated his/her understanding and acceptance.   Dental advisory given  Plan Discussed with: CRNA and Anesthesiologist  Anesthesia Plan Comments:       Anesthesia Quick Evaluation

## 2017-11-11 NOTE — Anesthesia Postprocedure Evaluation (Signed)
Anesthesia Post Note  Patient: Clyda GreenerMelinda Zelman  Procedure(s) Performed: CESAREAN SECTION (N/A )     Patient location during evaluation: Mother Baby Anesthesia Type: Spinal Level of consciousness: awake and alert and oriented Pain management: satisfactory to patient Vital Signs Assessment: post-procedure vital signs reviewed and stable Respiratory status: spontaneous breathing and nonlabored ventilation Cardiovascular status: stable Postop Assessment: no headache, no backache, patient able to bend at knees, no signs of nausea or vomiting and adequate PO intake Anesthetic complications: no    Last Vitals:  Vitals:   11/11/17 1400 11/11/17 1440  BP: 114/67 124/76  Pulse: 75 74  Resp: (!) 23 18  Temp:  36.4 C  SpO2: 100%     Last Pain:  Vitals:   11/11/17 1440  TempSrc: Oral   Pain Goal:                 Avarose Mervine

## 2017-11-11 NOTE — H&P (Signed)
Kristy GreenerMelinda Tucker is a 31 y.o. female presenting for repeat cesarean section  31 yo [id number] who presents for repeat cesarean section. The patient has a history of a 41 week IUFD and a history of classical cesarean section. She presents for repeat cesarean section at 37 weeks due to prior classical cesarean OB History    Gravida Para Term Preterm AB Living   6 3 3   2 2    SAB TAB Ectopic Multiple Live Births   1 1   0 2     Past Medical History:  Diagnosis Date  . Asthma    Past Surgical History:  Procedure Laterality Date  . BARTHOLIN GLAND CYST EXCISION    . CESAREAN SECTION N/A 10/10/2015   Performed by Waynard Reedsoss, Sumi Lye, MD at [facility]   Family History: family history includes Asthma in her brother; Diabetes in her maternal uncle; Hearing loss in her mother. Social History:  reports that  has never smoked. she has never used smokeless tobacco. She reports that she does not drink alcohol or use drugs.     Maternal Diabetes: No Genetic Screening: Normal Maternal Ultrasounds/Referrals: Normal Fetal Ultrasounds or other Referrals:  None Maternal Substance Abuse:  No Significant Maternal Medications:  None Significant Maternal Lab Results:  None Other Comments:  None  ROS History   Resp. rate 18, height 5\' 8"  (1.727 m), weight 122 kg (269 lb), last menstrual period 02/24/2017, unknown if currently breastfeeding. Exam Physical Exam  Prenatal labs: ABO, Rh: --/--/O POS (11/16 1025) Antibody: NEG (11/16 1025) Rubella: Immune (05/15 0000) RPR: Non Reactive (11/16 1025)  HBsAg: Negative (05/15 0000)  HIV: Non-reactive (05/15 0000)  GBS:     Assessment/Plan: 1) Admit 2) SCDs 3) Proceed with repeat cesarean section   Waynard ReedsKendra Adrienne Trombetta 11/11/2017, 11:48 AM

## 2017-11-11 NOTE — Addendum Note (Signed)
Addendum  created 11/11/17 1852 by Shanon PayorGregory, Isabellarose Kope M, CRNA   Sign clinical note

## 2017-11-11 NOTE — Transfer of Care (Signed)
Immediate Anesthesia Transfer of Care Note  Patient: Kristy GreenerMelinda Tucker  Procedure(s) Performed: CESAREAN SECTION (N/A )  Patient Location: PACU  Anesthesia Type:Spinal  Level of Consciousness: awake, alert  and oriented  Airway & Oxygen Therapy: Patient Spontanous Breathing  Post-op Assessment: Report given to RN and Post -op Vital signs reviewed and stable  Post vital signs: Reviewed and stable  Last Vitals:  Vitals:   11/11/17 1040  Resp: 18    Last Pain:  Vitals:   11/11/17 1040  TempSrc: Oral         Complications: No apparent anesthesia complications

## 2017-11-11 NOTE — Addendum Note (Signed)
Addendum  created 11/11/17 1559 by Heather RobertsSinger, Idella Lamontagne, MD   Order list changed

## 2017-11-12 ENCOUNTER — Encounter (HOSPITAL_COMMUNITY): Payer: Self-pay | Admitting: *Deleted

## 2017-11-12 LAB — CBC
HEMATOCRIT: 29.1 % — AB (ref 36.0–46.0)
HEMOGLOBIN: 9.6 g/dL — AB (ref 12.0–15.0)
MCH: 26.1 pg (ref 26.0–34.0)
MCHC: 33 g/dL (ref 30.0–36.0)
MCV: 79.1 fL (ref 78.0–100.0)
Platelets: 233 10*3/uL (ref 150–400)
RBC: 3.68 MIL/uL — ABNORMAL LOW (ref 3.87–5.11)
RDW: 14.5 % (ref 11.5–15.5)
WBC: 13.3 10*3/uL — ABNORMAL HIGH (ref 4.0–10.5)

## 2017-11-12 NOTE — Progress Notes (Signed)
Subjective: Postpartum Day 1: Cesarean Delivery Patient reports incisional pain, [name], + flatus and no problems voiding.    Objective: Vital signs in last 24 hours: Temp:  [97 F (36.1 C)-98.6 F (37 C)] 98.2 F (36.8 C) (11/20 0605) Pulse Rate:  [59-84] 62 (11/20 0605) Resp:  [12-23] 16 (11/20 0605) BP: (93-128)/(56-80) 93/63 (11/20 0605) SpO2:  [96 %-100 %] 100 % (11/20 0605) Weight:  [122 kg (269 lb)] 122 kg (269 lb) (11/19 1040)  Physical Exam:  General: alert, cooperative and no distress Lochia: appropriate Uterine Fundus: firm Incision: healing well, no significant drainage, no dehiscence DVT Evaluation: No evidence of DVT seen on physical exam. Negative Homan's sign. No cords or calf tenderness.  Recent Labs    11/12/17 0609  HGB 9.6*  HCT 29.1*    Assessment/Plan: Status post Cesarean section. Doing well postoperatively.  Continue current care. Baby in NICU - had trouble feeding and was grunting Will give patient an abdominal binder  Kristy Tucker 11/12/2017, 9:24 AM

## 2017-11-12 NOTE — Progress Notes (Signed)
Mom set up with a DEBP. Discussed setup, frequency, cleaning, and milk storage. Labels at bedside. Royston CowperIsley, Josselin Gaulin E, RN

## 2017-11-12 NOTE — Lactation Note (Signed)
This note was copied from a baby's chart. Lactation Consultation Note  Patient Name: Kristy Tucker UJWJX'BToday's Date: 11/12/2017 Reason for consult: Initial assessment  NICU baby 5526 hours old. Mom states that she nursed 2 older children for 16 months. Mom reports that baby latched earlier this morning on left breast and she now has a small blister on the nipple. Mom questioning if baby's frenulum short. Could not elicit a suckle with this LC's gloved finger--baby did not like the taste of the glove--so could not fully assess baby's oral anatomy. Also, baby fussy, wanting to latch, so placed baby in cross-cradle position on right breast. Mom initially wanting to cradle baby, but then able to support baby's head and her breast in cross-cradle, and baby latched deeply and suckled rhythmically with some swallows noted.   Enc mom to nurse as she and baby able, and to continue pumping every 2-3 hours as she reports she has been doing so far.   Maternal Data    Feeding Feeding Type: Breast Fed Length of feed: 15 min  LATCH Score Latch: Grasps breast easily, tongue down, lips flanged, rhythmical sucking.  Audible Swallowing: Spontaneous and intermittent  Type of Nipple: Everted at rest and after stimulation  Comfort (Breast/Nipple): Soft / non-tender  Hold (Positioning): Assistance needed to correctly position infant at breast and maintain latch.  LATCH Score: 9  Interventions Interventions: Breast feeding basics reviewed;Assisted with latch;Skin to skin;Hand express;Breast compression;Adjust position;Support pillows;Position options  Lactation Tools Discussed/Used     Consult Status Consult Status: Follow-up Date: 11/13/17 Follow-up type: In-patient    Sherlyn HayJennifer D Aryani Daffern 11/12/2017, 2:28 PM

## 2017-11-13 NOTE — Progress Notes (Signed)
Patient is doing well.  She is [name], ambulating, voiding.  Pain is controlled.  Lochia is appropriate  Baby came out of nicu last night  Vitals:   11/12/17 0800 11/12/17 0930 11/12/17 2004 11/13/17 0552  BP:  114/61 (!) 112/59 125/68  Pulse:  69 68 65  Resp:  18 18 16   Temp:  97.7 F (36.5 C) (!) 97.4 F (36.3 C) (!) 97.3 F (36.3 C)  TempSrc:  Oral Oral Oral  SpO2: 100% 100%    Weight:      Height:        NAD Abdomen:  soft, appropriate tenderness, incisions intact and without erythema or drainage ext:    Symmetric, 1+ edema bilaterally  Lab Results  Component Value Date   WBC 13.3 (H) 11/12/2017   HGB 9.6 (L) 11/12/2017   HCT 29.1 (L) 11/12/2017   MCV 79.1 11/12/2017   PLT 233 11/12/2017    --/--/O POS (11/16 1025)/RImmune  A/P    31 y.o. [id number] POD 2 s/p RCS Routine post op and postpartum care.   Baby was in NICU for respiratory distress--now back rooming in with mom.   Anticipate discharge tomorrow unless baby is discharged today

## 2017-11-13 NOTE — Lactation Note (Signed)
This note was copied from a baby's chart. Lactation Consultation Note  Patient Name: Kristy Clyda GreenerMelinda Clendenen RUEAV'WToday's Date: 11/13/2017   RN called and stated pt had questions for LC.  LC unavailable to come at that moment. RN asked pt what question the pt had and pt thought she was hearing clicking noise with feeding.  RN assessed feeding at that moment while LC on phone and stated RN was hearing swallows with feeding.  LC encouraged RN to do basic education with patient.   RN later told LC baby was breastfeeding well and [facility] problems developed.    Ga 37.1; BW 7 lbs, 2.8 oz.  6% weight loss.  Infant currently 4056 hrs old.   Was previously [facility] for grunting and ret[facility]  RN stated no issues with breathing since infant has come back.   Infant has breastfed x7 (10-25 min) + attempts x3 (5-7 min); voids-4; stools-2 in past 24 hrs; LS-10 by RN.     Lendon KaVann, Teshia Mahone Walker 11/13/2017, 8:58 PM

## 2017-11-14 MED ORDER — OXYCODONE-ACETAMINOPHEN 5-325 MG PO TABS: 2.0000 | ORAL_TABLET | ORAL | 0 refills | Status: DC | PRN

## 2017-11-14 NOTE — Lactation Note (Signed)
This note was copied from a baby's chart. Lactation Consultation Note; Experienced BF mom getting ready to latch baby as I went into room. Reports breasts are feeling fuller today. Assisted mom with getting deeper latch and she reports that feels better. Asking about tight frenulum as her last baby had one and had laser treatment, This baby lifts his tongue to roof of mouth and extends well beyond gumline. Mom reports this feels better than her last baby. Breast softer after nursing. Reviewed engorgement prevention and treatment. Does not have pump for home. I showed her how to use pump pieces as DEBP. No questions at present. Reviewed our phone number, OP appointments and BFSG as resources for support after DC. BF brochure given as mom states she does not have one, To call prn  Patient Name: Kristy Clyda GreenerMelinda Tucker UJWJX'BToday's Date: 11/14/2017 Reason for consult: Follow-up assessment;NICU baby   Maternal Data Formula Feeding for Exclusion: No Has patient been taught Hand Expression?: Yes Does the patient have breastfeeding experience prior to this delivery?: Yes  Feeding Feeding Type: Breast Fed Length of feed: 10 min  LATCH Score Latch: Grasps breast easily, tongue down, lips flanged, rhythmical sucking.  Audible Swallowing: Spontaneous and intermittent  Type of Nipple: Everted at rest and after stimulation  Comfort (Breast/Nipple): Filling, red/small blisters or bruises, mild/mod discomfort  Hold (Positioning): Assistance needed to correctly position infant at breast and maintain latch.  LATCH Score: 8  Interventions Interventions: Breast feeding basics reviewed;Position options;Expressed milk  Lactation Tools Discussed/Used     Consult Status Consult Status: Complete    Pamelia HoitWeeks, Jhene Westmoreland D 11/14/2017, 10:29 AM

## 2017-11-14 NOTE — Discharge Summary (Signed)
Obstetric Discharge Summary Reason for Admission: cesarean section Prenatal Procedures: NST and ultrasound Intrapartum Procedures: cesarean: low cervical, transverse Postpartum Procedures: none Complications-Operative and Postpartum: none Hemoglobin  Date Value Ref Range Status  11/12/2017 9.6 (L) 12.0 - 15.0 g/dL Final   HCT  Date Value Ref Range Status  11/12/2017 29.1 (L) 36.0 - 46.0 % Final    Physical Exam:  General: alert, cooperative, appears stated age and no distress Lochia: appropriate Uterine Fundus: firm Incision: healing well DVT Evaluation: No evidence of DVT seen on physical exam.  Discharge Diagnoses: history of classical c/s  Discharge Information: Date: 11/14/2017 Activity: pelvic rest Diet: routine Medications: PNV, Ibuprofen, Iron and Percocet Condition: stable Instructions: refer to practice specific booklet Discharge to: home Follow-up Information    Waynard Reedsoss, Kendra, MD. Schedule an appointment as soon as possible for a visit in 1 month(s).   Specialty:  Obstetrics and Gynecology Contact information: [address] [place] [address] [phone]           Newborn Data: Live born female  Birth Weight: 7 lb 2.8 oz (3255 g) APGAR: 8, 9  Newborn Delivery   Birth date/time:  11/11/2017 12:26:00 Delivery type:  C-Section, Low Transverse C-section categorization:  Repeat     Home with mother.  Maisa Bedingfield E 11/14/2017, 10:05 AM

## 2017-11-14 NOTE — Progress Notes (Signed)
POD#2 Pt doing well PO. Ready for discharge. VSSAF IMP/Stable Plan/will discharge.

## 2018-04-17 ENCOUNTER — Ambulatory Visit (HOSPITAL_COMMUNITY)
Admission: EM | Admit: 2018-04-17 | Discharge: 2018-04-17 | Disposition: A | Discharge status: Home / Self Care | Payer: Medicaid Other | Attending: Internal Medicine | Admitting: Internal Medicine

## 2018-04-17 ENCOUNTER — Ambulatory Visit (INDEPENDENT_AMBULATORY_CARE_PROVIDER_SITE_OTHER): Payer: Medicaid Other

## 2018-04-17 ENCOUNTER — Other Ambulatory Visit: Payer: Self-pay

## 2018-04-17 ENCOUNTER — Encounter (HOSPITAL_COMMUNITY): Payer: Self-pay | Admitting: Emergency Medicine

## 2018-04-17 DIAGNOSIS — M25562 Pain in left knee: Secondary | ICD-10-CM

## 2018-04-17 MED ORDER — IBUPROFEN 800 MG PO TABS: 800.0000 mg | ORAL_TABLET | Freq: Three times a day (TID) | ORAL | 0 refills | Status: DC

## 2018-04-17 NOTE — ED Provider Notes (Signed)
[facility]    CSN: [id number] Arrival date & time: 04/17/18  1009     History   Chief Complaint Chief Complaint  Patient presents with  . Knee Pain    HPI Kristy Tucker is a 32 y.o. female no contributing past medical history presenting today for evaluation of left knee pain.  Patient states she fell yesterday at home onto a laminate/concrete floor with direct blow to her knee.  Feels like she slightly twisted her knee.  Denies any popping sensation, but states she may have had a tearing sensation.  Since she has had worsening pain, swelling and difficulty with weightbearing.  Denies numbness or tingling.  Denies sensation of instability  HPI  Past Medical History:  Diagnosis Date  . Asthma     Patient Active Problem List   Diagnosis Date Noted  . Cesarean delivery, delivered, current hospitalization 10/11/2015  . Gestational hypertension 10/10/2015  . Evaluate anatomy not seen on prior sonogram   . History of intrauterine fetal death, currently pregnant   . Abnormal biochemical finding on antenatal screening of mother   . Obesity complicating pregnancy in second trimester   . [redacted] weeks gestation of pregnancy   . History of fetal loss 04/29/2015  . Obesity affecting pregnancy in second trimester 04/29/2015  . Abnormal maternal serum screening test 04/29/2015  . High risk pregnancy with low PAPPA (pregnancy-associated plasma protein A) 04/29/2015  . IUFD (intrauterine fetal death) 11/18/14  . Malposition or malpresentation of fetus, antepartum 10/01/2014  . Health care maintenance 10/07/2012    Past Surgical History:  Procedure Laterality Date  . BARTHOLIN GLAND CYST EXCISION    . CESAREAN SECTION N/A 10/10/2015   Procedure: CESAREAN SECTION;  Surgeon: Waynard Reeds, MD;  Location: [facility];  Service: Obstetrics;  Laterality: N/A;  . CESAREAN SECTION N/A 11/11/2017   Procedure: CESAREAN SECTION;  Surgeon: Waynard Reeds, MD;  Location: [facility];   Service: Obstetrics;  Laterality: N/A;    OB History    Gravida  6   Para  4   Term  4   Preterm      AB  2   Living  3     SAB  1   TAB  1   Ectopic      Multiple  0   Live Births  3            Home Medications    Prior to Admission medications   Medication Sig Start Date End Date Taking? Authorizing Provider  norethindrone-ethinyl estradiol (CYCLAFEM,ALYACEN) 0.5/0.75/1-35 MG-MCG tablet Take 1 tablet by mouth daily.   Yes [provider]  albuterol (PROAIR HFA) 108 (90 BASE) MCG/ACT inhaler Inhale 2 puffs every 6 (six) hours as needed into the lungs for wheezing or shortness of breath.     [provider]  ferrous sulfate 325 (65 FE) MG tablet Take 325 mg 3 (three) times a week by mouth.    [provider]  ibuprofen (ADVIL,MOTRIN) 800 MG tablet Take 1 tablet (800 mg total) by mouth 3 (three) times daily. 04/17/18   Kaysa Roulhac C, PA-C  Prenatal Vit-Fe Fumarate-FA (PRENATAL MULTIVITAMIN) TABS tablet Take 1 tablet by mouth at bedtime.    [provider]    Family History Family History  Problem Relation Age of Onset  . Hearing loss Mother   . Asthma Brother   . Diabetes Maternal Uncle     Social History Social History   Tobacco Use  .  Smoking status: Never Smoker  . Smokeless tobacco: Never Used  Substance Use Topics  . Alcohol use: No  . Drug use: No     Allergies   Latex   Review of Systems Review of Systems  Constitutional: Negative for fatigue and fever.  Respiratory: Negative for shortness of breath.   Cardiovascular: Negative for chest pain.  Gastrointestinal: Negative for nausea and vomiting.  Musculoskeletal: Positive for arthralgias, gait problem, joint swelling and myalgias. Negative for neck pain and neck stiffness.  Skin: Negative for color change, rash and wound.  Neurological: Negative for dizziness, syncope, light-headedness, numbness and headaches.     Physical Exam Triage Vital  Signs ED Triage Vitals  Enc Vitals Group     BP 04/17/18 1036 124/75     Pulse Rate 04/17/18 1036 99     Resp --      Temp 04/17/18 1036 98.4 F (36.9 C)     Temp Source 04/17/18 1036 Oral     SpO2 04/17/18 1036 100 %     Weight --      Height --      Head Circumference --      Peak Flow --      Pain Score 04/17/18 1034 5     Pain Loc --      Pain Edu? --      Excl. in GC? --    No data found.  Updated Vital Signs BP 124/75 (BP Location: Left Arm)   Pulse 99   Temp 98.4 F (36.9 C) (Oral)   SpO2 100%   Breastfeeding? Yes   Visual Acuity Right Eye Distance:   Left Eye Distance:   Bilateral Distance:    Right Eye Near:   Left Eye Near:    Bilateral Near:     Physical Exam  Constitutional: She appears well-developed and well-nourished. No distress.  HENT:  Head: Normocephalic and atraumatic.  Eyes: Conjunctivae are normal.  Neck: Neck supple.  Cardiovascular: Normal rate.  No murmur heard. Pulmonary/Chest: Effort normal. No respiratory distress.  Musculoskeletal:  Mild swelling to left knee, no obvious bruising or deformity, no overlying erythema, tenderness to palpation of medial joint line extending posteriorly.  Nontender to lateral joint line or over the patella.  Patient has limited flexion beyond 90 to 100 degrees.  Patient is able to fully extend although does elicit pain.  No laxity appreciated with varus or valgus stress, Lachman's anterior posterior drawer.  Exams were limited due to patient's pain and failure to relax.  Neurological: She is alert.  Skin: Skin is warm and dry.  Psychiatric: She has a normal mood and affect.  Nursing note and vitals reviewed.    UC Treatments / Results  Labs (all labs ordered are listed, but only abnormal results are displayed) Labs Reviewed - No data to display  EKG None Radiology No results found.  Procedures Procedures (including critical care time)  Medications Ordered in UC Medications - No data to  display   Initial Impression / Assessment and Plan / UC Course  I have reviewed the triage vital signs and the nursing notes.  Pertinent labs & imaging results that were available during my care of the patient were reviewed by me and considered in my medical decision making (see chart for details).     X-ray negative for fracture or dislocation.  Likely knee contusion versus possible meniscal tear on medial meniscus.  Will treat conservatively with anti-inflammatories, rest, ice and elevation.  Follow-up with orthopedics  if symptoms persisting or continuing to worsen throughout the week w/o improvement.  Final Clinical Impressions(s) / UC Diagnoses   Final diagnoses:  Acute pain of left knee    ED Discharge Orders        Ordered    ibuprofen (ADVIL,MOTRIN) 800 MG tablet  3 times daily     04/17/18 1134       Controlled Substance Prescriptions [place] Controlled Substance Registry consulted? Not Applicable   Lew Dawes, [place] 04/17/18 1145

## 2018-04-17 NOTE — Discharge Instructions (Addendum)
No fracture or dislocation on x ray.   Use anti-inflammatories for pain/swelling. You may take up to 800 mg Ibuprofen every 8 hours with food. You may supplement Ibuprofen with Tylenol [phone] mg every 8 hours.   Ice knee multiple times a day.  Please follow-up with orthopedics if knee pain continuing to worsen or not improving in 1 to 2 weeks.

## 2018-04-17 NOTE — ED Triage Notes (Signed)
States fell yesterday at home and having left knee pain and swelling

## 2018-05-23 ENCOUNTER — Telehealth (HOSPITAL_COMMUNITY): Payer: Self-pay | Admitting: Lactation Services

## 2018-05-23 NOTE — Telephone Encounter (Signed)
Mom called about having a Betamethasone injection to her knee while BF. Read her info in Medications and Mother's Milk Book that it is considered an L3-No Data-Probably Compatible. Dr. Sheffield Slider does recommend waiting 4 hour before nursing again with high doses,  mom voiced understanding. Mom to call with further questions/concerns as needed.

## 2019-02-17 ENCOUNTER — Ambulatory Visit: Payer: Self-pay | Admitting: Family Medicine

## 2019-02-17 VITALS — BP 120/78 | HR 113 | Temp 98.5°F | Resp 14 | Wt 214.4 lb

## 2019-02-17 DIAGNOSIS — R059 Cough, unspecified: Secondary | ICD-10-CM

## 2019-02-17 DIAGNOSIS — J020 Streptococcal pharyngitis: Secondary | ICD-10-CM

## 2019-02-17 DIAGNOSIS — J029 Acute pharyngitis, unspecified: Secondary | ICD-10-CM

## 2019-02-17 DIAGNOSIS — R05 Cough: Secondary | ICD-10-CM

## 2019-02-17 LAB — POCT RAPID STREP A (OFFICE)
Rapid Strep A Screen: NEGATIVE
Rapid Strep A Screen: POSITIVE — AB

## 2019-02-17 LAB — POCT INFLUENZA A/B
Influenza A, POC: NEGATIVE
Influenza B, POC: NEGATIVE

## 2019-02-17 MED ORDER — AMOXICILLIN 875 MG PO TABS: 875.0000 mg | ORAL_TABLET | Freq: Two times a day (BID) | ORAL | 0 refills | Status: DC

## 2019-02-17 NOTE — Patient Instructions (Addendum)
Strep Throat    Strep throat is a bacterial infection of the throat. Your health care provider may call the infection tonsillitis or pharyngitis, depending on whether there is swelling in the tonsils or at the back of the throat. Strep throat is most common during the cold months of the year in children who are 5-32 years of age, but it can happen during any season in people of any age. This infection is spread from person to person (contagious) through coughing, sneezing, or close contact.  What are the causes?  Strep throat is caused by the bacteria called Streptococcus pyogenes.  What increases the risk?  This condition is more likely to develop in:  · People who spend time in crowded places where the infection can spread easily.  · People who have close contact with someone who has strep throat.  What are the signs or symptoms?  Symptoms of this condition include:  · Fever or chills.  · Redness, swelling, or pain in the tonsils or throat.  · Pain or difficulty when swallowing.  · White or yellow spots on the tonsils or throat.  · Swollen, tender glands in the neck or under the jaw.  · Red rash all over the body (rare).  How is this diagnosed?  This condition is diagnosed by performing a rapid strep test or by taking a swab of your throat (throat culture test). Results from a rapid strep test are usually ready in a few minutes, but throat culture test results are available after one or two days.  How is this treated?  This condition is treated with antibiotic medicine.  Follow these instructions at home:  Medicines  · Take over-the-counter and prescription medicines only as told by your health care provider.  · Take your antibiotic as told by your health care provider. Do not stop taking the antibiotic even if you start to feel better.  · Have family members who also have a sore throat or fever tested for strep throat. They may need antibiotics if they have the strep infection.  Eating and drinking  · Do not  share food, drinking cups, or personal items that could cause the infection to spread to other people.  · If swallowing is difficult, try eating soft foods until your sore throat feels better.  · Drink enough fluid to keep your urine clear or pale yellow.  General instructions  · Gargle with a salt-water mixture 3-4 times per day or as needed. To make a salt-water mixture, completely dissolve ½-1 tsp of salt in 1 cup of warm water.  · Make sure that all household members wash their hands well.  · Get plenty of rest.  · Stay home from school or work until you have been taking antibiotics for 24 hours.  · Keep all follow-up visits as told by your health care provider. This is important.  Contact a health care provider if:  · The glands in your neck continue to get bigger.  · You develop a rash, cough, or earache.  · You cough up a thick liquid that is green, yellow-brown, or bloody.  · You have pain or discomfort that does not get better with medicine.  · Your problems seem to be getting worse rather than better.  · You have a fever.  Get help right away if:  · You have new symptoms, such as vomiting, severe headache, stiff or painful neck, chest pain, or shortness of breath.  · You have severe throat   pain, drooling, or changes in your voice.  · You have swelling of the neck, or the skin on the neck becomes red and tender.  · You have signs of dehydration, such as fatigue, dry mouth, and decreased urination.  · You become increasingly sleepy, or you cannot wake up completely.  · Your joints become red or painful.  This information is not intended to replace advice given to you by your health care provider. Make sure you discuss any questions you have with your health care provider.  Document Released: 12/07/2000 Document Revised: 08/08/2016 Document Reviewed: 04/04/2015  Elsevier Interactive Patient Education © 2019 [employer] Inc[employer]

## 2019-02-17 NOTE — Progress Notes (Signed)
Kristy Tucker is a 33 y.o. female who presents today with 2 days of sore throat that the patient has attempted treatments of tylenol and this has helped with her  Symptoms. 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 She  reports that she has for the past 3 days had fever, cough and increasingly sore throat. She denies any known contact with similar conditions and reports that her son began to run fevers starting today. She denies any daily medication use or any chronic conditions. She reports that she is otherwise healthy.  Review of Systems  Constitutional: Positive for chills, fever and malaise/fatigue.  HENT: Positive for sore throat. Negative for congestion, ear discharge, ear pain and sinus pain.   Eyes: Negative.   Respiratory: Positive for cough. Negative for sputum production and shortness of breath.   Cardiovascular: Negative.  Negative for chest pain.  Gastrointestinal: Negative for abdominal pain, diarrhea, nausea and vomiting.  Genitourinary: Negative for dysuria, frequency, hematuria and urgency.  Musculoskeletal: Negative for myalgias.  Skin: Negative.  Negative for itching and rash.  Neurological: Negative for headaches.  Endo/Heme/Allergies: Negative.   Psychiatric/Behavioral: Negative.     Kristy Tucker has a current medication list which includes the following prescription(s): albuterol, ferrous sulfate, ibuprofen, norethindrone-ethinyl estradiol, and prenatal multivitamin. Also is allergic to latex.  Kristy Tucker  has a past medical history of Asthma. Also  has a past surgical history that includes Bartholin gland cyst excision; Cesarean section (N/A, 10/10/2015); and Cesarean section (N/A, 11/11/2017).    O: Vitals:   02/17/19 1707  BP: 120/78  Pulse: (!) 113  Resp: 14  Temp: 98.5 F (36.9 C)  SpO2: 99%     Physical Exam Vitals signs reviewed.  Constitutional:      General: She is not in acute distress.    Appearance:  Normal appearance. She is well-developed and normal weight. She is not ill-appearing, toxic-appearing or diaphoretic.  HENT:     Head: Normocephalic.     Right Ear: Hearing, tympanic membrane, ear canal and external ear normal.     Left Ear: Hearing, tympanic membrane, ear canal and external ear normal.     Nose: Nose normal.     Mouth/Throat:     Lips: Pink.     Mouth: Mucous membranes are moist.     Palate: Mass present.     Pharynx: Uvula midline. Posterior oropharyngeal erythema present. No pharyngeal swelling, oropharyngeal exudate or uvula swelling.     Tonsils: No tonsillar exudate or tonsillar abscesses. Swelling: 3+ on the right. 3+ on the left.  Neck:     Musculoskeletal: Normal range of motion and neck supple.  Cardiovascular:     Rate and Rhythm: Regular rhythm. Tachycardia present.     Pulses: Normal pulses.     Heart sounds: Normal heart sounds.     Comments: Repeat HR 103 manual by provider Pulmonary:     Effort: Pulmonary effort is normal.     Breath sounds: Normal breath sounds.  Abdominal:     General: Bowel sounds are normal.     Palpations: Abdomen is soft.  Musculoskeletal: Normal range of motion.  Lymphadenopathy:     Head:     Right side of head: Tonsillar adenopathy present. No submental or submandibular adenopathy.     Left side of head: Tonsillar adenopathy present. No submental or submandibular adenopathy.     Cervical: Cervical adenopathy present.     Right  cervical: Superficial cervical adenopathy present.     Left cervical: Superficial cervical adenopathy present.  Neurological:     Mental Status: She is alert and oriented to person, place, and time.    A: 1. Streptococcal pharyngitis   2. Sore throat   3. Cough     P: 1. Streptococcal pharyngitis - amoxicillin (AMOXIL) 875 MG tablet; Take 1 tablet (875 mg total) by mouth 2 (two) times daily. Positive strep- amoxicillin safe for lactating- reviewed risk and benefits with patient. Discussed  supportive care and avoiding sharing food and drinks with household contacts as well as changing toothbrush 48 hours post abx. Initiation. Patient is otherwise young and healthy and should recover without complication if treatment plan is adhered to.  1. Sore throat - POCT rapid strep A - POCT Influenza A/B Results for orders placed or performed in visit on 02/17/19 (from the past 24 hour(s))  POCT rapid strep A     Status: Normal   Collection Time: 02/17/19  5:27 PM  Result Value Ref Range   Rapid Strep A Screen Negative Negative  POCT Influenza A/B     Status: Normal   Collection Time: 02/17/19  5:27 PM  Result Value Ref Range   Influenza A, POC Negative Negative   Influenza B, POC Negative Negative  POCT rapid strep A     Status: Abnormal   Collection Time: 02/17/19  5:29 PM  Result Value Ref Range   Rapid Strep A Screen Positive (A) Negative   2. Cough OTC product of choice  Discussed with patient exam findings, suspected diagnosis etiology and  reviewed recommended treatment plan and follow up, including complications and indications for urgent medical follow up and evaluation. Medications including use and indications reviewed with patient. Patient provided relevant patient education on diagnosis and/or relevant related condition that were discussed and reviewed with patient at discharge. Patient verbalized understanding of information provided and agrees with plan of care (POC), all questions answered.

## 2019-02-19 ENCOUNTER — Telehealth: Payer: Self-pay | Admitting: Emergency Medicine

## 2019-02-19 NOTE — Telephone Encounter (Signed)
Left message following up on visit with [facility] 

## 2019-02-20 ENCOUNTER — Telehealth: Payer: Self-pay

## 2019-02-20 ENCOUNTER — Other Ambulatory Visit: Payer: Self-pay | Admitting: Nurse Practitioner

## 2019-02-20 MED ORDER — AZITHROMYCIN 250 MG PO TABS
ORAL_TABLET | ORAL | 0 refills | Status: DC
Start: 2019-02-20 — End: 2020-05-27

## 2019-02-20 NOTE — Progress Notes (Signed)
Patient called informing she is having side effects from the Amoxicillin she was prescribed on 2/25.  Will call a prescription in for azithromycin for the patient.  Patient is requesting medication be sent to [facility] on [address], [place].

## 2019-02-20 NOTE — Telephone Encounter (Signed)
Patient returned the call, and states the amoxicillin is causing her nausea, vomiting, and diarrhea . Th provider is going to change the medication, because the patient can not tolerated the amoxicillin, even if the patient is taking it with food and enough water. Provider is aware the patient is breastfeeding. I let the patient know the medication is safe while she is breastfeeding and if she has milk safe she can use it while she is taking the medication

## 2019-11-25 ENCOUNTER — Other Ambulatory Visit: Payer: Self-pay | Admitting: Obstetrics and Gynecology

## 2019-11-25 DIAGNOSIS — N9089 Other specified noninflammatory disorders of vulva and perineum: Secondary | ICD-10-CM

## 2019-12-04 ENCOUNTER — Ambulatory Visit
Admission: RE | Admit: 2019-12-04 | Discharge: 2019-12-04 | Disposition: A | Discharge status: Home / Self Care | Payer: BC Managed Care – PPO | Source: Ambulatory Visit | Attending: Obstetrics and Gynecology | Admitting: Obstetrics and Gynecology

## 2019-12-04 DIAGNOSIS — N9089 Other specified noninflammatory disorders of vulva and perineum: Secondary | ICD-10-CM

## 2020-01-22 ENCOUNTER — Other Ambulatory Visit: Payer: Self-pay | Admitting: Surgery

## 2020-01-22 DIAGNOSIS — R1909 Other intra-abdominal and pelvic swelling, mass and lump: Secondary | ICD-10-CM

## 2020-01-30 ENCOUNTER — Ambulatory Visit
Admission: RE | Admit: 2020-01-30 | Discharge: 2020-01-30 | Disposition: A | Discharge status: Home / Self Care | Payer: BC Managed Care – PPO | Source: Ambulatory Visit | Attending: Surgery | Admitting: Surgery

## 2020-01-30 ENCOUNTER — Other Ambulatory Visit: Payer: Self-pay

## 2020-01-30 DIAGNOSIS — R1909 Other intra-abdominal and pelvic swelling, mass and lump: Secondary | ICD-10-CM

## 2020-01-30 MED ORDER — GADOBENATE DIMEGLUMINE 529 MG/ML IV SOLN
20.0000 mL | Freq: Once | INTRAVENOUS | Status: AC | PRN
  Administered 2020-01-30: 20 mL via INTRAVENOUS

## 2020-03-08 IMAGING — US US PELVIS LIMITED
1 series · 14 of 16 positions shown · non-contrast
Comparison: None

CLINICAL DATA: Fluctuant LEFT groin mass, palpable finding for 1
year, question hernia

EXAM:
LIMITED ULTRASOUND OF PELVIS
TECHNIQUE: Limited transabdominal ultrasound examination of the pelvis was
performed.

[Series 1: us pelvis limited · 0.08mm/px · 16 acquisitions, 14 frames shown]
[im 1/16]
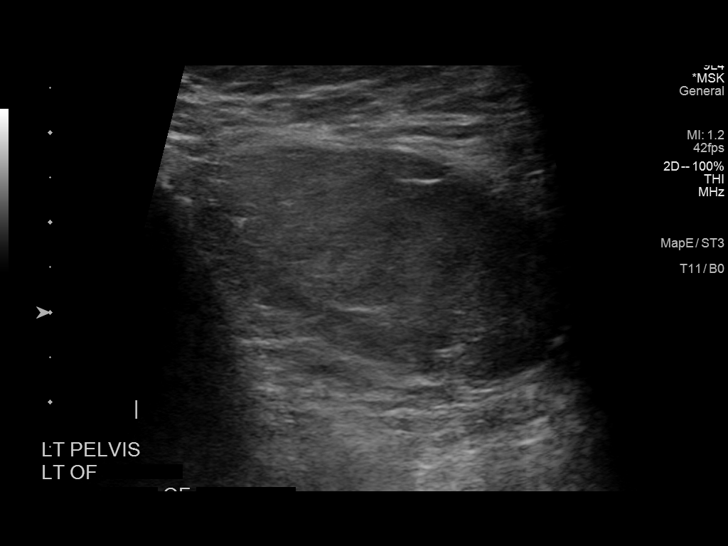
[im 2/16]
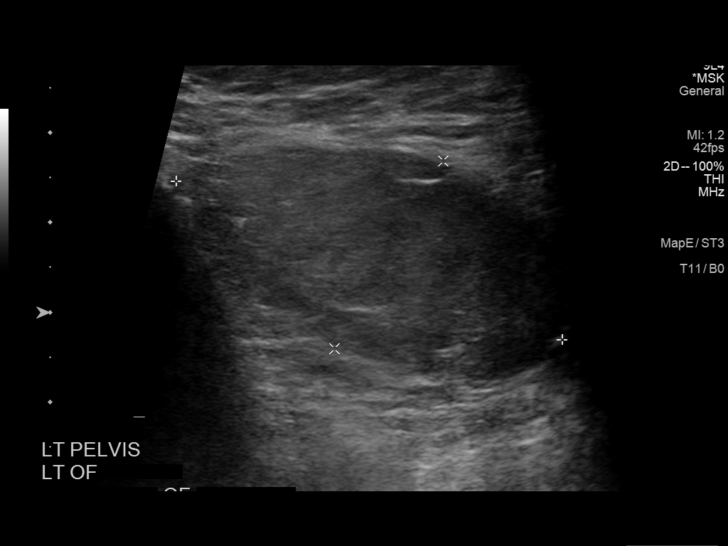
[im 3/16]
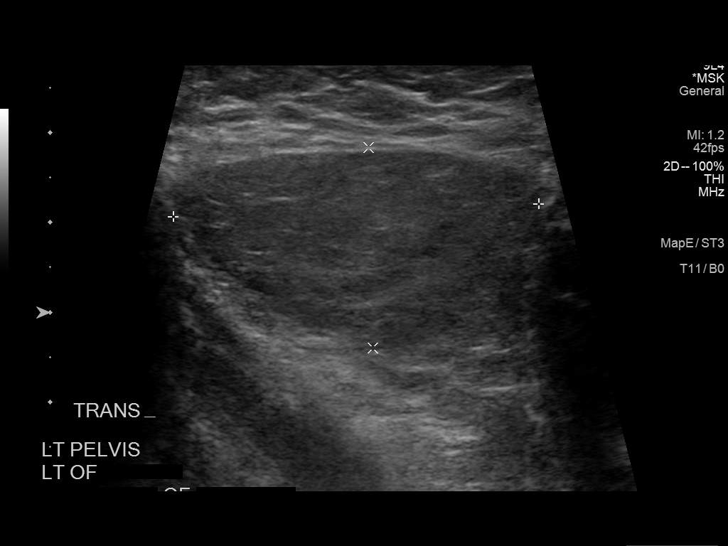
[im 5/16]
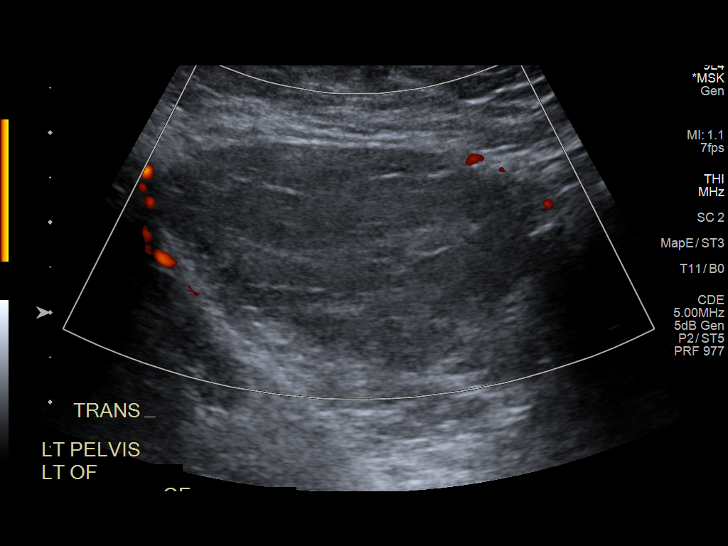
[im 6/16]
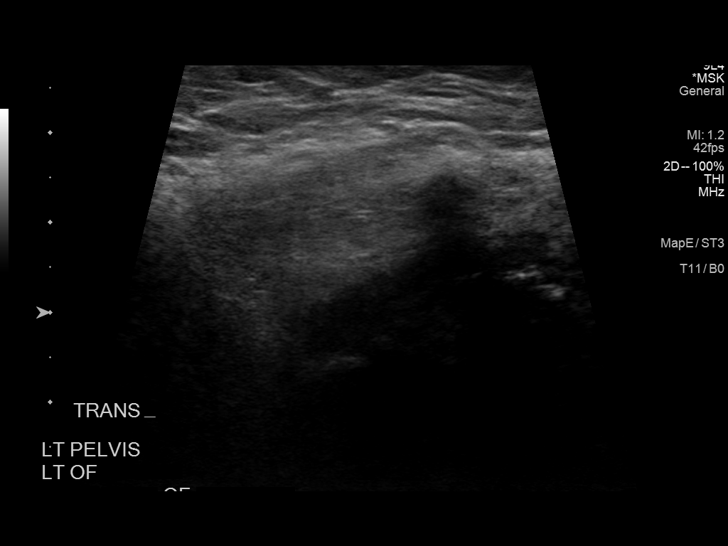
[im 7/16]
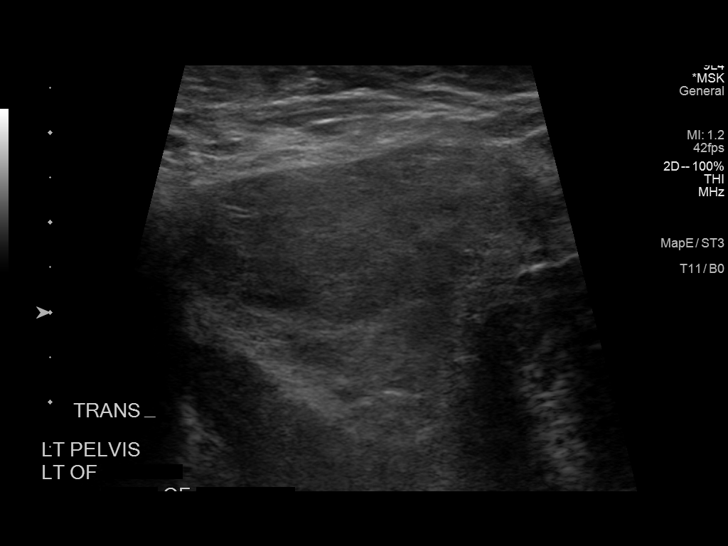
[im 8/16]
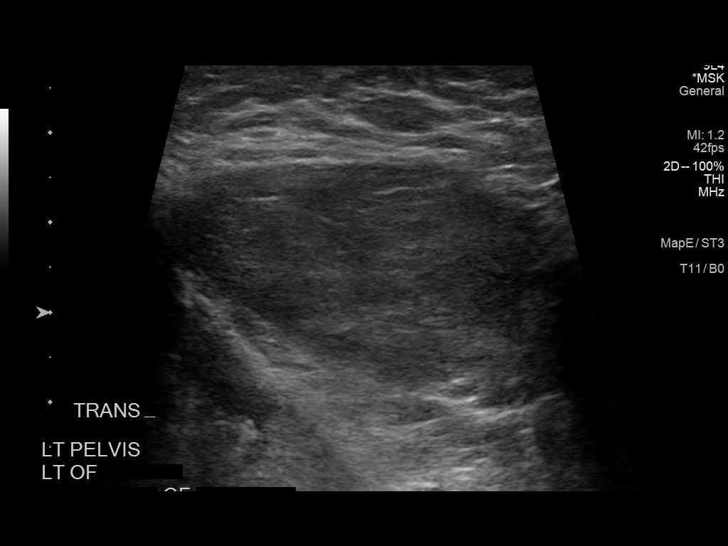
[im 9/16]
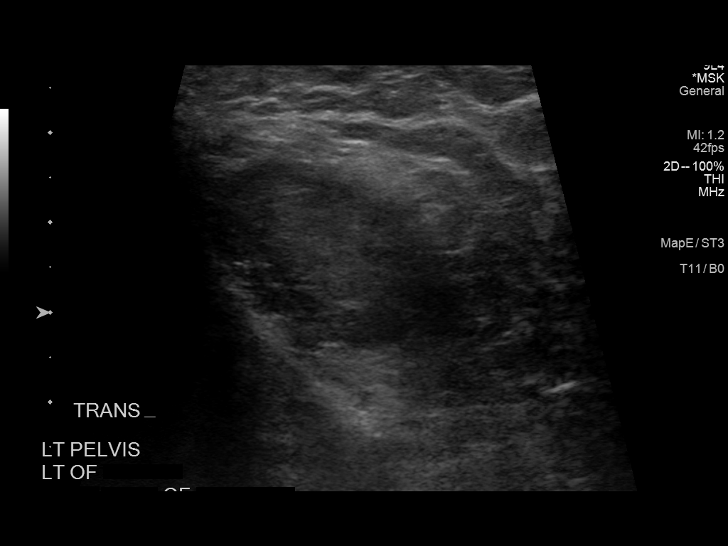
[im 10/16]
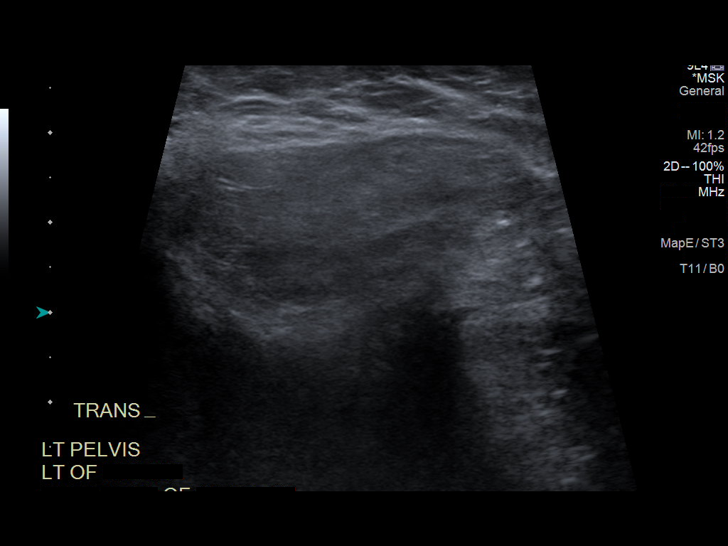
[im 11/16]
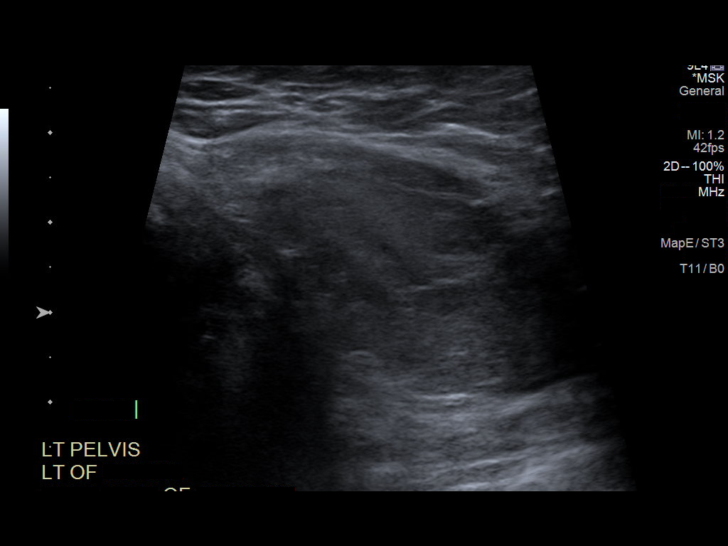
[im 13/16]
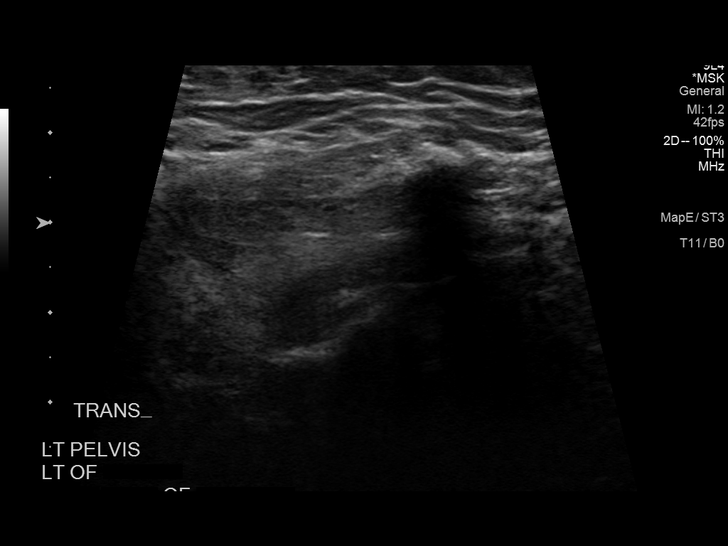
[im 14/16]
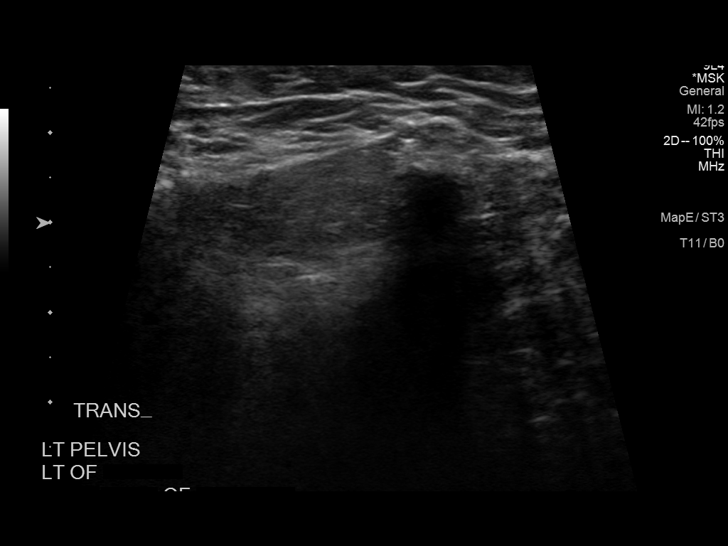
[im 15/16]
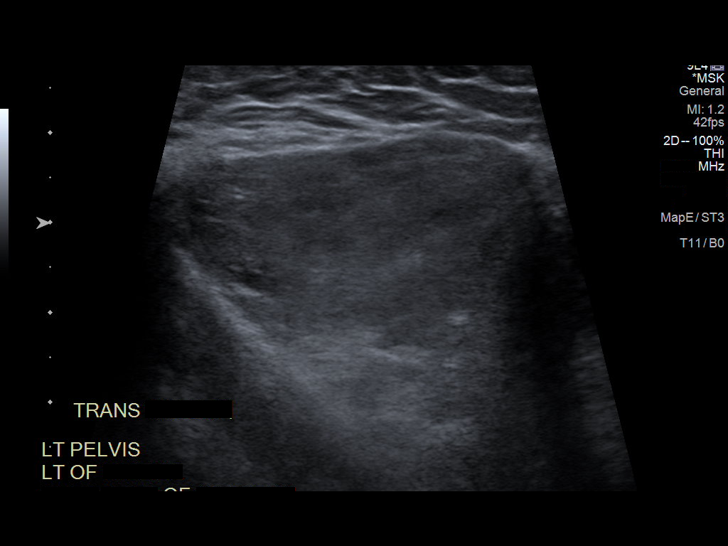
[im 16/16]
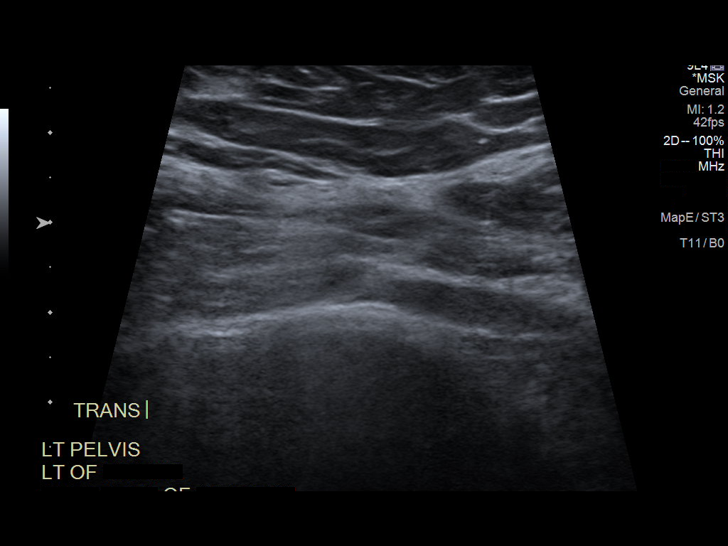

[14 of 16 positions shown; findings below may reference images not displayed]

FINDINGS: No hernia identified.

No cystic collections seen.

At the area of concern at the LEFT pelvis LEFT of midline, on
transverse imaging an ovoid subcutaneous structure is identified
which measures 3.8 cm transverse by 1.5 cm AP.

This is less well delineated on longitudinal imaging.

This does not have the architecture of a normal lymph node.

This could represent an abnormal lymph node, lipoma, or other soft
tissue mass.
IMPRESSION: Atypical solid mass at site of clinical concern at LEFT groin, of
uncertain etiology.

Sonographic appearance is nonspecific; consider further
characterization by MR.

## 2020-05-27 ENCOUNTER — Ambulatory Visit: Payer: Self-pay | Admitting: Internal Medicine

## 2020-05-27 ENCOUNTER — Other Ambulatory Visit: Payer: Self-pay

## 2020-05-27 ENCOUNTER — Encounter: Payer: Self-pay | Admitting: Internal Medicine

## 2020-05-27 VITALS — BP 128/80 | HR 78 | Resp 12 | Ht 68.0 in | Wt 226.0 lb

## 2020-05-27 DIAGNOSIS — W57XXXA Bitten or stung by nonvenomous insect and other nonvenomous arthropods, initial encounter: Secondary | ICD-10-CM

## 2020-05-27 MED ORDER — CEPHALEXIN 250 MG PO CAPS: ORAL_CAPSULE | ORAL | 0 refills | Status: AC

## 2020-05-27 NOTE — Patient Instructions (Signed)
Keep leg elevated Cold water soaks twice daily or cold pack to area--can do this anytime starting to itch more.. Cetirizine 10 mg in the morning and Benadryl 25 mg at night for itch. Call progress on Monday morning.

## 2020-05-27 NOTE — Progress Notes (Signed)
     Subjective:    Patient ID: Kristy Tucker, female   DOB: 06-14-1986, 34 y.o.   MRN: [id number]   HPI   Here to establish  Bite on her right lateral ankle first symptomatic 4 days ago.  Thought it was a mosquito bite.  She gets really big reactions to mosquito bites.  Started bubbling up and oozing the next day.  Now with redness and swelling extending down her ankle to lateral foot.   No fevers.   Has had a headache, but not sure if related. Was applying hydrogen peroxide, and Dettol, and antibacterial wash, but the latter just today.    Td up to date  Current Meds  Medication Sig  . Cholecalciferol (VITAMIN D3) 25 MCG (1000 UT) CAPS Take by mouth. 1 daily  . Magnesium 200 MG TABS Take by mouth. 1 daily  . Menaquinone-7 (VITAMIN K2 PO) Take by mouth. 1 daily  . Multiple Vitamin (MULTIVITAMIN) tablet Take 1 tablet by mouth daily.   Allergies  Allergen Reactions  . Gadolinium Derivatives Hives and Itching    Pt medicated following exam with 25mg  benadryl per Dr. .   . Latex Rash and Other (See Comments)    Rash if wearing gloves     Review of Systems    Objective:   BP 128/80 (BP Location: Left Arm, Patient Position: Sitting, Cuff Size: Normal)   Pulse 78   Resp 12   Ht 5\' 8"  (1.727 m)   Wt 226 lb (102.5 kg)   LMP 05/16/2020   Breastfeeding No   BMI 34.36 kg/m   Physical Exam  NAD Lungs:  CTA CV:  RRR with normal S1 and S2, No S3, S4 or murmur.  Radial and DP pulses normal and equal. Right lateral high ankle--above and posterior to malleolus--dime size circular area with superficial ulceration and perhaps previous blistering.  Oozing clear yellow fluid.  Base is inflamed with erythematous edges as well and red streaking down to lateral foot.  Edema of lateral ankle and foot area as well.  No increased heat or tenderness to the area.   Assessment & Plan  1.  Insect bite:  Perhaps a spider bite, possibly with secondary bacterial infection/impetigo:   Cephalexin 250 mg 4 times daily for 7 days.  To call if worsens Keep foot elevated.  Letter off from work so she can do this. Cold compresses Zyrtec in morning and benadryl at night for itching.  Call progress report on Monday.  2.  COvID vaccination:  Discussed covid vaccine and encouraged getting vaccinated.  Discussed could come on Monday.
